# Patient Record
Sex: Female | Born: 1975 | Race: White | Hispanic: No | Marital: Married | Smoking: Never smoker
Health system: Southern US, Community
[De-identification: ages and names within clinical notes are randomized; demographics above are authoritative.]

## PROBLEM LIST (undated history)

## (undated) DIAGNOSIS — M19011 Primary osteoarthritis, right shoulder: Secondary | ICD-10-CM

## (undated) DIAGNOSIS — R112 Nausea with vomiting, unspecified: Secondary | ICD-10-CM

## (undated) DIAGNOSIS — Z9889 Other specified postprocedural states: Secondary | ICD-10-CM

## (undated) DIAGNOSIS — K900 Celiac disease: Secondary | ICD-10-CM

## (undated) DIAGNOSIS — G43909 Migraine, unspecified, not intractable, without status migrainosus: Secondary | ICD-10-CM

## (undated) DIAGNOSIS — M7550 Bursitis of unspecified shoulder: Secondary | ICD-10-CM

## (undated) DIAGNOSIS — L237 Allergic contact dermatitis due to plants, except food: Secondary | ICD-10-CM

## (undated) DIAGNOSIS — M75100 Unspecified rotator cuff tear or rupture of unspecified shoulder, not specified as traumatic: Secondary | ICD-10-CM

## (undated) DIAGNOSIS — N83209 Unspecified ovarian cyst, unspecified side: Secondary | ICD-10-CM

## (undated) HISTORY — PX: TONSILLECTOMY: SUR1361

## (undated) HISTORY — PX: CARPAL TUNNEL RELEASE: SHX101

## (undated) HISTORY — PX: CERVICAL LAMINECTOMY: SHX94

## (undated) HISTORY — PX: BACK SURGERY: SHX140

## (undated) HISTORY — PX: CHOLECYSTECTOMY: SHX55

---

## 1998-04-01 ENCOUNTER — Inpatient Hospital Stay: Admission: AD | Admit: 1998-04-01 | Discharge: 1998-04-01 | Payer: Self-pay | Admitting: Obstetrics and Gynecology

## 1998-04-02 ENCOUNTER — Ambulatory Visit (HOSPITAL_COMMUNITY): Admission: RE | Admit: 1998-04-02 | Discharge: 1998-04-02 | Payer: Self-pay | Admitting: Obstetrics and Gynecology

## 1998-05-14 ENCOUNTER — Ambulatory Visit: Admission: RE | Admit: 1998-05-14 | Discharge: 1998-05-14 | Payer: Self-pay | Admitting: Neurology

## 1998-06-10 ENCOUNTER — Inpatient Hospital Stay (HOSPITAL_COMMUNITY): Admission: AD | Admit: 1998-06-10 | Discharge: 1998-06-13 | Payer: Self-pay | Admitting: Obstetrics and Gynecology

## 1998-06-20 ENCOUNTER — Inpatient Hospital Stay (HOSPITAL_COMMUNITY): Admission: AD | Admit: 1998-06-20 | Discharge: 1998-06-20 | Payer: Self-pay | Admitting: Obstetrics and Gynecology

## 1998-06-24 ENCOUNTER — Observation Stay (HOSPITAL_COMMUNITY): Admission: RE | Admit: 1998-06-24 | Discharge: 1998-06-25 | Payer: Self-pay | Admitting: General Surgery

## 2001-10-22 ENCOUNTER — Other Ambulatory Visit: Admission: RE | Admit: 2001-10-22 | Discharge: 2001-10-22 | Payer: Self-pay | Admitting: Obstetrics and Gynecology

## 2002-05-07 ENCOUNTER — Inpatient Hospital Stay (HOSPITAL_COMMUNITY): Admission: AD | Admit: 2002-05-07 | Discharge: 2002-05-09 | Payer: Self-pay | Admitting: Obstetrics and Gynecology

## 2002-06-10 ENCOUNTER — Other Ambulatory Visit: Admission: RE | Admit: 2002-06-10 | Discharge: 2002-06-10 | Payer: Self-pay | Admitting: Obstetrics and Gynecology

## 2003-09-16 ENCOUNTER — Other Ambulatory Visit: Admission: RE | Admit: 2003-09-16 | Discharge: 2003-09-16 | Payer: Self-pay | Admitting: Obstetrics and Gynecology

## 2004-09-27 ENCOUNTER — Other Ambulatory Visit: Admission: RE | Admit: 2004-09-27 | Discharge: 2004-09-27 | Payer: Self-pay | Admitting: Obstetrics and Gynecology

## 2006-12-22 ENCOUNTER — Encounter (INDEPENDENT_AMBULATORY_CARE_PROVIDER_SITE_OTHER): Payer: Self-pay | Admitting: Obstetrics and Gynecology

## 2006-12-22 ENCOUNTER — Ambulatory Visit (HOSPITAL_COMMUNITY): Admission: RE | Admit: 2006-12-22 | Discharge: 2006-12-22 | Payer: Self-pay | Admitting: Obstetrics and Gynecology

## 2006-12-22 HISTORY — PX: HYSTEROSCOPY W/D&C: SHX1775

## 2006-12-22 HISTORY — PX: ENDOMETRIAL ABLATION W/ NOVASURE: SUR434

## 2009-04-16 ENCOUNTER — Ambulatory Visit: Payer: Self-pay | Admitting: Genetic Counselor

## 2010-04-23 ENCOUNTER — Emergency Department (HOSPITAL_BASED_OUTPATIENT_CLINIC_OR_DEPARTMENT_OTHER)
Admission: EM | Admit: 2010-04-23 | Discharge: 2010-04-23 | Payer: Self-pay | Source: Home / Self Care | Admitting: Emergency Medicine

## 2010-04-24 ENCOUNTER — Encounter: Payer: Self-pay | Admitting: Obstetrics and Gynecology

## 2010-04-26 LAB — CBC
HCT: 38.7 % (ref 36.0–46.0)
MCH: 28.2 pg (ref 26.0–34.0)
MCHC: 34.4 g/dL (ref 30.0–36.0)
RDW: 13.6 % (ref 11.5–15.5)

## 2010-04-26 LAB — BASIC METABOLIC PANEL
Calcium: 9 mg/dL (ref 8.4–10.5)
Creatinine, Ser: 0.9 mg/dL (ref 0.4–1.2)
GFR calc Af Amer: >60 mL/min (ref >60)
GFR calc non Af Amer: >60 mL/min (ref >60)
Glucose, Bld: 99 mg/dL (ref 70–99)
Sodium: 141 mEq/L (ref 135–145)

## 2010-04-26 LAB — URINALYSIS, ROUTINE W REFLEX MICROSCOPIC
Ketones, ur: NEGATIVE mg/dL
Nitrite: NEGATIVE
Protein, ur: NEGATIVE mg/dL

## 2010-04-26 LAB — DIFFERENTIAL
Basophils Absolute: 0 10*3/uL (ref 0.0–0.1)
Basophils Relative: 0 % (ref 0–1)
Eosinophils Relative: 1 % (ref 0–5)
Monocytes Absolute: 0.7 10*3/uL (ref 0.1–1.0)

## 2010-04-28 LAB — CBC
HCT: 40.8 % (ref 36.0–46.0)
MCHC: 33.8 g/dL (ref 30.0–36.0)
MCV: 84.3 fL (ref 78.0–100.0)
Platelets: 212 10*3/uL (ref 150–400)
RDW: 13.3 % (ref 11.5–15.5)
WBC: 6.8 10*3/uL (ref 4.0–10.5)

## 2010-04-30 ENCOUNTER — Ambulatory Visit (HOSPITAL_COMMUNITY)
Admission: RE | Admit: 2010-04-30 | Discharge: 2010-04-30 | Payer: Self-pay | Source: Home / Self Care | Attending: Obstetrics and Gynecology | Admitting: Obstetrics and Gynecology

## 2010-04-30 HISTORY — PX: ENDOMETRIAL ABLATION: SHX621

## 2010-05-10 NOTE — Op Note (Signed)
Sheri Evans, Sheri Evans            ACCOUNT NO.:  [id number]  MEDICAL RECORD NO.:  [id number]          PATIENT TYPE:  AMB  LOCATION:  [place]                           FACILITY:  [facility]  PHYSICIAN:  Daleen Bo. Gaetano Net, M.D. DATE OF BIRTH:  01-20-1976  DATE OF PROCEDURE:  04/30/2010 DATE OF DISCHARGE:  04/23/2010                              OPERATIVE REPORT   PREOPERATIVE DIAGNOSIS:  Pelvic pain.  POSTOPERATIVE DIAGNOSIS:  Endometriosis.  PROCEDURE:  Open laparoscopy with ablation of endometriosis.  SURGEON:  Daleen Bo. Gaetano Net, MD  ANESTHESIA:  General with endotracheal intubation.  ESTIMATED BLOOD LOSS:  Minimal.  INDICATIONS AND CONSENT:  The patient is a 35 year old married white female G3, P3, husband status post vasectomy and the patient status post endometrial ablation with increasingly frequent and severe primary left and some right lower quadrant pain.  Pain is especially worse before her menses.  Laparoscopy has been discussed preoperatively.  Potential risks and complications have been discussed preoperatively including but not limited to infection, organ damage, bleeding requiring transfusion of blood products with HIV and hepatitis acquisition, DVT, PE, pneumonia, recurrent pain, and pain with intercourse.  All questions have been answered and consent was signed on the chart.  FINDINGS:  Upper abdomen was grossly normal.  Appendix was normal. Uterus was about 8 weeks in size, somewhat boggy but smooth in contour. Anterior and posterior cul-de-sacs and pelvic sidewalls are normal. Right ovary and both fallopian tubes are normal.  Left ovary has a small light brown implant consistent with endometriosis and several less than 1-cm follicles that are smooth and translucent.  DESCRIPTION OF PROCEDURE:  The patient was taken to the operating room where she was identified, placed in dorsal supine position, and general anesthesia was induced via endotracheal intubation.  She was  placed in dorsal lithotomy position.  Time-out was undertaken.  She was prepped with Hibiclens abdominally and vaginally.  Bladder straight catheterized.  Hulka tenaculum was placed in the uterus as a manipulator and she was draped in a sterile fashion.  The infraumbilical and suprapubic areas were injected in the midline with 0.5% plain Marcaine. An infraumbilical incision was made.  Dissection was carried out to the level of the fascia.  This was grasped and elevated and taken down in the midline with Mayo scissors.  The peritoneum was bluntly perforated with finger.  Anchoring sutures of 0 Vicryl were placed at the 3 and 9 o'clock position under good visualization.  A disposable open laparoscopic trocar sleeve was placed.  The balloon was inflated.  The sleeve was anchored in place with Vicryl sutures.  Pneumoperitoneum was induced and the operative laparoscope was used.  A small suprapubic incision was made in the midline and a 5-mm Xcel bladeless disposable trocar sleeve was placed under direct visualization without difficulty. Thorough inspection reveals the above findings.  The small implant on the left ovary was ablated thoroughly with bipolar cautery.  Interceed was back loaded through the laparoscope, placed around the ovary. Approximately 25 mL of the remaining 0.5% plain Marcaine was instilled into peritoneal cavity.  Suprapubic trocar sleeve was removed and the umbilical trocar sleeve was removed.  The 0 Vicryl sutures were used to close the fascia under good visualization.  Inspection reveals good closure.  The subcutaneous tissue was closed with 0 Vicryl and the skin was closed with interrupted 3-0 Vicryl suture which was also used on the suprapubic incision.  Dermabond was applied.  Hulka tenaculum was removed and no bleeding was noted.  All counts were correct.  The patient was awakened, taken to the recovery room in stable condition.     Daleen Bo Gaetano Net,  M.D.     JET/MEDQ  D:  04/30/2010  T:  04/30/2010  Job:  [id number]  Electronically Signed by Everlene Farrier M.D. on 05/10/2010 09:02:11 AM

## 2010-08-17 NOTE — Op Note (Signed)
Sheri Evans, Sheri Evans            ACCOUNT NO.:  [id number]   MEDICAL RECORD NO.:  [id number]          PATIENT TYPE:  AMB   LOCATION:  [place]                           FACILITY:  [facility]   PHYSICIAN:  Daleen Bo. Gaetano Net, M.D. DATE OF BIRTH:  09/14/1975   DATE OF PROCEDURE:  12/22/2006  DATE OF DISCHARGE:                               OPERATIVE REPORT   PREOPERATIVE DIAGNOSIS:  Menorrhagia.   POSTOPERATIVE DIAGNOSIS:  Menorrhagia.   PROCEDURE:  Novasure endometrial ablation, hysteroscopy with dilatation  and curettage.  1% Xylocaine paracervical block.   SURGEON:  Everlene Farrier, MD.   ANESTHESIA:  General.   SPECIMENS:  Endometrial curettings to pathology.   ESTIMATED BLOOD LOSS:  Minimal.   I&O'S OF DISTENDING MEDIA:  30 mL deficit.   INDICATIONS AND CONSENT:  This patient is a 35 year old, married, white  female, G3, P3, husband status post vasectomy with heavy bleeding.  Details are dictated in the history and physical.  Hysteroscopy D&C,  Novasure endometrial ablation is discussed preoperatively. The potential  risks and complications have been discussed preoperatively including but  not limited to infection, uterine perforation, organ damage, bleeding  requiring transfusion of blood products with possible HIV and hepatitis  acquisition, DVT, PE, pneumonia, recurrent heavy bleeding.  All  questions have been answered and consent is signed on the chart.   FINDINGS:  Endometrial cavities without abnormal structure.   DESCRIPTION OF PROCEDURE:  The patient is taken to the operating room  where she is identified, placed in dorsal supine position and general  anesthesia is induced.  She is then placed in the dorsal lithotomy  position where she is prepped, bladder straight catheterized and she is  draped in a sterile fashion. A bivalve speculum is placed in the vagina.  The anterior cervical lip is injected with 1% Xylocaine and grasped with  a single-tooth tenaculum.  Paracervical  block was placed in the 2, 4, 5,  7, 8 and 10 o'clock positions with approximately 20 mL of plain 1%  Xylocaine.  The cervical length and uterine length were then taken.  The  cervix was gently and progressively dilated to a 29 dilator.  The  diagnostic hysteroscope is placed into the cervical canal and advanced  under direct visualization using distending media.  The above findings  are noted.  The hysteroscope is withdrawn and sharp curettage is carried  out.  The Novasure endometrial device is then placed.  However, careful  inspection reveals that after a warning light is noted on the machine  that the array is not deployed, the device is then removed.  Inspection  after the device has been removed reveals that although the handle is  closed the array does not indeed open even though the reference gauge  displays a cavity width suggesting that it had deployed.  This device  was never used for ablation. It  was then replaced with a new device.  The cavity test is passed on the  first attempt.  Ablation is carried out.  The device is removed and seen  to be intact.  Inspection with the hysteroscope again reveals  no  evidence of perforation.  All instruments are removed. The patient is  awakened, taken to recovery room in stable condition.      Daleen Bo Gaetano Net, M.D.  Electronically Signed     JET/MEDQ  D:  12/22/2006  T:  12/23/2006  Job:  [id number]

## 2010-08-17 NOTE — H&P (Signed)
Sheri Evans, Sheri Evans            ACCOUNT NO.:  [id number]   MEDICAL RECORD NO.:  [id number]          PATIENT TYPE:  AMB   LOCATION:                                FACILITY:  [facility]   PHYSICIAN:  Daleen Bo. Gaetano Net, M.D. DATE OF BIRTH:  Aug 24, 1975   DATE OF ADMISSION:  12/22/2006  DATE OF DISCHARGE:                              HISTORY & PHYSICAL   CHIEF COMPLAINT:  Heavy menses.   HISTORY OF PRESENTING ILLNESS:  This patient is a 35 year old married  white female G3, P3. Husband is status post vasectomy who has extremely  heavy bleeding.  She will change 6 pads in 2 hours.  Attempts at  controlling this have been unsuccessful.  She is being admitted for  hysteroscopy, D & C and NovaSure  endometrial ablation.  Potential risks  and complications have been discussed preoperatively.   PAST MEDICAL HISTORY:  Negative.   PAST SURGICAL HISTORY:  Laparoscopy with ablation of endometriosis in  1998.  Tonsillectomy.   OBSTETRICAL HISTORY:  Cesarean section x3.   FAMILY HISTORY:  Heart disease maternal grandmother.  Varicose veins in  mother.  Diabetes, maternal grandfather.   MEDICATIONS:  None.   ALLERGIES:  PENICILLIN, leading to swelling.   SOCIAL HISTORY:  Denies tobacco, alcohol, or drug abuse.   REVIEW OF SYSTEMS:  NEUROLOGICAL:  Denies headache.  CARDIAC:  No chest  pain.  PULMONARY:  Denies shortness of breath. GI: Denies recent changes  in bowel habits.   PHYSICAL EXAMINATION:  VITAL SIGNS:  Height 5 feet 9 inches. Weight 283  pounds.  Blood pressure 122/84.  HEENT: Without thyromegaly.  LUNGS: Clear to auscultation.  HEART: Regular rate and rhythm.  BACK:  Without CVA tenderness .  BREASTS:  Without masses, tracks, or discharge.  ABDOMEN:  Soft and nontender without masses.  PELVIC:  Both vagina and cervix without  lesions.  Uterus is 8 weeks in  size.  Vulva nontender.  Adnexa non-tender without masses.  EXTREMITIES: Grossly within normal limits.  NEUROLOGICAL:  Grossly within normal limits.   Ultrasound in the office on 11/07/2006 reveals a uterus measuring 10.1 x  7.7 x 5.3 cm. There is an 11 and 7 mm intramural fibroids noted. The  ovaries appear normal.   ASSESSMENT:  Menorrhagia.   PLAN:  Hysteroscopy D&C, and NovaSure endometrial ablation.      Daleen Bo Gaetano Net, M.D.  Electronically Signed     JET/MEDQ  D:  12/13/2006  T:  12/13/2006  Job:  [id number]

## 2010-08-20 NOTE — Op Note (Signed)
Sheri Evans, Sheri Evans                      ACCOUNT NO.:  [id number]   MEDICAL RECORD NO.:  [id number]                   PATIENT TYPE:  INP   LOCATION:  NA                                   FACILITY:  [facility]   PHYSICIAN:  Daleen Bo. Lyn Hollingshead, M.D.           DATE OF BIRTH:  [birth year]   DATE OF PROCEDURE:  05/07/2002  DATE OF DISCHARGE:                                 OPERATIVE REPORT   PREOPERATIVE DIAGNOSES:  1. Intrauterine pregnancy at term.  2. Previous cesarean section, desires repeat.   POSTOPERATIVE DIAGNOSES:  1. Intrauterine pregnancy at term.  2. Previous cesarean section, desires repeat.   PROCEDURE:  Low transverse cesarean section.   SURGEON:  Daleen Bo. Gaetano Net, M.D.   ASSISTANT:  Ralene Bathe. Matthew Saras, M.D.   ANESTHESIA:  Epidural, Juliann Pulse, M.D.   ESTIMATED BLOOD LOSS:  800 mL.   FINDINGS:  Viable female infant.  Apgars of 9 and 9 at one and five minutes,  respectively.  Birth weight 8 pounds 5 ounces.  Arterial cord pH 7.32.   INDICATIONS AND CONSENT:  This patient is a 35 year old married white female  with two previous cesarean sections who desires repeat.  Details are  discussed in the history and physical.  Cesarean section and its possible  risks and complications have been discussed with the patient preoperatively  including, but not limited to, infection, bowel, bladder, ureteral damage,  bleeding requiring transfusion of blood products with possible transfusion  reaction, HIV and hepatitis acquisition, DVT, PE, and pneumonia.  All  questions are answered and consent is signed on the chart.   PROCEDURE:  The patient is taken to the operating room where an epidural  anesthetic is placed.  She is placed in the dorsal supine position with a 15  degree left lateral wedge.  She is prepped.  Foley catheter is placed in the  bladder as a drain and she is draped in a sterile fashion.  After testing  for adequate epidural anesthesia, skin is entered  through the Pfannenstiel  incision and dissection is carried out in layers to the peritoneum.  Peritoneum is entered sharply and extended superiorly and inferiorly.  Vesicouterine peritoneum is taken down cephalolaterally and the bladder flap  is developed.  There is a 2 cm window in the uterine scar in the midline.  This is extended cephalolaterally with the fingers.  Artificial rupture of  membranes is carried out for clear fluid.  The vertex is delivered and oro  and nasopharynx are suctioned.  Remainder of the infant is delivered.  Good  cry and tone is noted.  Cord is clamped and cut and the infant is handed to  the waiting pediatrics team.  Placenta is manually delivered.  The uterus  has a heart shaped contour.  The uterine cavity is clean.  Uterus is closed  in a running locking fashion with 0 Monocryl suture.  Good hemostasis is  noted.  Tubes and ovaries are normal bilaterally.  Anterior peritoneum  is closed in a running fashion with 0 Monocryl which is also used to  reapproximate the pyramidalis muscle in the midline.  The anterior rectus  fascia is closed in a running fashion with 0 PDS suture and the skin is  closed with clips.  All counts are correct.  The patient is taken to  recovery room in stable condition.                                               Daleen Bo Lyn Hollingshead, M.D.    JET/MEDQ  D:  05/07/2002  T:  05/07/2002  Job:  [id number]

## 2010-08-20 NOTE — Discharge Summary (Signed)
Sheri Evans, Sheri Evans                      ACCOUNT NO.:  [id number]   MEDICAL RECORD NO.:  [id number]                   PATIENT TYPE:  INP   LOCATION:  9142                                 FACILITY:  [facility]   PHYSICIAN:  Maisie Fus  M.D.               DATE OF BIRTH:  10/07/1975   DATE OF ADMISSION:  05/07/2002  DATE OF DISCHARGE:  05/09/2002                                 DISCHARGE SUMMARY   ADMISSION DIAGNOSES:  1. Intrauterine pregnancy at term.  2. Previous Cesarean section, desires repeat.   DISCHARGE DIAGNOSES:  1. Status post low transverse Cesarean section.  2. Viable female infant.   PROCEDURE:  Repeat low transverse Cesarean section.   REASON FOR ADMISSION:  Please see dictated history and physical.   HOSPITAL COURSE:  The patient was a 35 year old married female, Gravida III,  Para II, that was admitted to [facility] for scheduled  Cesarean section delivery. The patient had had two previous Cesarean  sections and desired repeat. On the morning of admission, the patient was  prepped accordingly and taken to the OR where epidural was placed without  difficulty. A low transverse incision was made with the delivery of a viable  female infant weighing 8 pounds and 5 ounces with Apgar's of 9 at one minute  and 9 at five minutes. Arterial cord pH was 7.32. The patient tolerated the  procedure well and was taken to the recovery room  in stable condition. On  postoperative day one the patient did return to bowel function. Abdomen was  soft. Fundus was firm and nontender. Abdominal dressing was noted to have a  small amount of drainage noted on bandage. Labs revealed hemoglobin of 9.9,  platelet count 150,000 with WBC count of 10.3. IV was discontinued and  regular diet was ordered. On postoperative day two, vital signs were stable.  Fundus was firm and nontender. Abdomen was soft. Incision was noted to be  clean, dry and intact. The patient desired  discharge. Teaching was reviewed  with the patient and she was discharged to home.   CONDITION ON DISCHARGE:  Good.   DIET:  Regular as tolerated.   ACTIVITY:  No heavy lifting, no driving for two weeks and no vaginal entry.   FOLLOW UP:  The patient is to follow-up in the office in 2-3 days for staple  removal. Otherwise, she is to return to the office in one week for incision  check. She is to call for temperature greater than 100, persistent nausea  and vomiting, heavy vaginal bleeding, and/or redness or drainage to the  incisional site.   DISCHARGE MEDICATIONS:  1. Demerol 50 mg #30, one by mouth every four to six hours as needed.  2.     Motrin 600 mg #30, one by mouth every six hours as needed.  3. Prenatal vitamins one by mouth daily.  4. Colace one by mouth  daily as needed.     Juanda Chance, N.P.                        Maisie Fus  M.D.    CC/MEDQ  D:  06/04/2002  T:  06/04/2002  Job:  [id number]   cc:   Ralene Bathe. Matthew Saras, M.D.  [address], [place]  [place] [address]  Fax: [phone]

## 2010-08-20 NOTE — H&P (Signed)
   Sheri Evans, Sheri Evans                      ACCOUNT NO.:  [id number]   MEDICAL RECORD NO.:  [id number]                   PATIENT TYPE:  INP   LOCATION:  NA                                   FACILITY:  [facility]   PHYSICIAN:  Daleen Bo. Lyn Hollingshead, M.D.           DATE OF BIRTH:  [birth year]   DATE OF ADMISSION:  05/07/2002  DATE OF DISCHARGE:                                HISTORY & PHYSICAL   CHIEF COMPLAINT:  Repeat cesarean section.   HISTORY OF PRESENT ILLNESS:  The patient is a 35 year old married female,  G3, P2, with an EDC of May 07, 2002, established by six-week ultrasound,  who has had two previous cesarean sections and is being admitted for a  repeat cesarean section.  Prenatal care has been complicated by obesity.  Glucola was normal at 28 weeks.  She had a complaint of mild shortness of  breath and spots before her eyes in the mornings when she awakens on May 01, 2002.  At that time, blood pressure was 126/58 with trace protein in the  urine.  All questions have been answered, and she is being admitted for a  repeat cesarean section.   PAST OBSTETRIC HISTORY:  See [place] form.   PAST SURGICAL HISTORY:  See [place] form.   PAST MEDICAL HISTORY:  See [place] form.   FAMILY HISTORY:  See [place] form.   MEDICATIONS:  Prenatal vitamins.   ALLERGIES:  Penicillin.   LABORATORY DATA:  Group B strep culture is negative on April 09, 2002.  Blood type O positive.  RH antibody screen negative.  RPR nonreactive.  Rubella immune.  Hepatitis B surface antigen negative.  Gonorrhea and  Chlamydia negative.  PTT negative.   ASSESSMENT:  1. Intrauterine pregnancy at 40 and 0/7 weeks.  2. History of two previous cesarean sections.   PLAN:  Repeat cesarean section.                                               Daleen Bo Lyn Hollingshead, M.D.    JET/MEDQ  D:  05/06/2002  T:  05/06/2002  Job:  [id number]

## 2011-01-13 LAB — CBC
MCHC: 33.8
MCV: 81.3
Platelets: 278
RDW: 14.8 — ABNORMAL HIGH

## 2011-01-13 LAB — BASIC METABOLIC PANEL
BUN: 10
CO2: 28
Calcium: 9.3
Chloride: 102
Creatinine, Ser: 0.92
GFR calc Af Amer: >60

## 2011-04-05 HISTORY — PX: ABDOMINAL HYSTERECTOMY: SHX81

## 2011-05-12 ENCOUNTER — Encounter (HOSPITAL_BASED_OUTPATIENT_CLINIC_OR_DEPARTMENT_OTHER): Payer: Self-pay | Admitting: *Deleted

## 2011-05-12 ENCOUNTER — Emergency Department (HOSPITAL_BASED_OUTPATIENT_CLINIC_OR_DEPARTMENT_OTHER)
Admission: EM | Admit: 2011-05-12 | Discharge: 2011-05-12 | Disposition: A | Discharge status: Home / Self Care | Payer: BC Managed Care – PPO | Attending: Emergency Medicine | Admitting: Emergency Medicine

## 2011-05-12 ENCOUNTER — Emergency Department (INDEPENDENT_AMBULATORY_CARE_PROVIDER_SITE_OTHER): Payer: BC Managed Care – PPO

## 2011-05-12 DIAGNOSIS — R109 Unspecified abdominal pain: Secondary | ICD-10-CM

## 2011-05-12 DIAGNOSIS — N809 Endometriosis, unspecified: Secondary | ICD-10-CM

## 2011-05-12 DIAGNOSIS — N949 Unspecified condition associated with female genital organs and menstrual cycle: Secondary | ICD-10-CM

## 2011-05-12 DIAGNOSIS — R110 Nausea: Secondary | ICD-10-CM | POA: Insufficient documentation

## 2011-05-12 DIAGNOSIS — N9489 Other specified conditions associated with female genital organs and menstrual cycle: Secondary | ICD-10-CM | POA: Insufficient documentation

## 2011-05-12 LAB — COMPREHENSIVE METABOLIC PANEL
AST: 21 U/L (ref 0–37)
Albumin: 4.1 g/dL (ref 3.5–5.2)
Alkaline Phosphatase: 64 U/L (ref 39–117)
BUN: 9 mg/dL (ref 6–23)
Creatinine, Ser: 0.8 mg/dL (ref 0.50–1.10)
Potassium: 4.3 mEq/L (ref 3.5–5.1)
Total Protein: 7.6 g/dL (ref 6.0–8.3)

## 2011-05-12 LAB — URINALYSIS, ROUTINE W REFLEX MICROSCOPIC
Ketones, ur: NEGATIVE mg/dL
Leukocytes, UA: NEGATIVE
Nitrite: NEGATIVE
Protein, ur: NEGATIVE mg/dL
Urobilinogen, UA: 0.2 mg/dL (ref 0.0–1.0)

## 2011-05-12 LAB — DIFFERENTIAL
Basophils Absolute: 0 10*3/uL (ref 0.0–0.1)
Basophils Relative: 0 % (ref 0–1)
Eosinophils Absolute: 0 10*3/uL (ref 0.0–0.7)
Monocytes Relative: 7 % (ref 3–12)
Neutrophils Relative %: 68 % (ref 43–77)

## 2011-05-12 LAB — CBC
Hemoglobin: 14.6 g/dL (ref 12.0–15.0)
MCH: 29.2 pg (ref 26.0–34.0)
MCHC: 34.8 g/dL (ref 30.0–36.0)
Platelets: 243 10*3/uL (ref 150–400)
RDW: 12.9 % (ref 11.5–15.5)

## 2011-05-12 LAB — PREGNANCY, URINE: Preg Test, Ur: NEGATIVE

## 2011-05-12 LAB — LIPASE, BLOOD: Lipase: 27 U/L (ref 11–59)

## 2011-05-12 LAB — WET PREP, GENITAL
Trich, Wet Prep: NONE SEEN
Yeast Wet Prep HPF POC: NONE SEEN

## 2011-05-12 MED ORDER — OXYCODONE-ACETAMINOPHEN 5-325 MG PO TABS: 2.0000 | ORAL_TABLET | ORAL | Status: AC | PRN

## 2011-05-12 MED ORDER — IOHEXOL 300 MG/ML  SOLN
20.0000 mL | INTRAMUSCULAR | Status: DC | PRN
  Administered 2011-05-12: 20 mL via ORAL

## 2011-05-12 MED ORDER — IOHEXOL 300 MG/ML  SOLN
100.0000 mL | Freq: Once | INTRAMUSCULAR | Status: AC | PRN
  Administered 2011-05-12: 100 mL via INTRAVENOUS

## 2011-05-12 MED ORDER — ONDANSETRON HCL 4 MG/2ML IJ SOLN
4.0000 mg | Freq: Once | INTRAMUSCULAR | Status: AC
  Administered 2011-05-12: 4 mg via INTRAVENOUS
  Filled 2011-05-12: qty 2

## 2011-05-12 MED ORDER — MORPHINE SULFATE 4 MG/ML IJ SOLN
4.0000 mg | Freq: Once | INTRAMUSCULAR | Status: AC
  Administered 2011-05-12: 4 mg via INTRAVENOUS
  Filled 2011-05-12: qty 1

## 2011-05-12 NOTE — ED Notes (Signed)
Sunday achy felt like had flu with off and on abdominal cramping today became more severe and localized at 415 pm no nausea or vomiting

## 2011-05-12 NOTE — ED Provider Notes (Signed)
History     CSN: [id number]  Arrival date & time 05/12/11  1633   First MD Initiated Contact with Patient 05/12/11 1652      Chief Complaint  Patient presents with  . Abdominal Pain    (Consider location/radiation/quality/duration/timing/severity/associated sxs/prior treatment) HPI Comments: Pt states that she has a history of endometriosis and this feels different:pt states that she has had lower abdominal pain the last 4 days but today the pain got worse and localized to the left side:pt denies history of similar symptoms  Patient is a 36 y.o. female presenting with abdominal pain. The history is provided by the patient.  Abdominal Pain The primary symptoms of the illness include abdominal pain and nausea. The primary symptoms of the illness do not include fever, vomiting, diarrhea, dysuria, vaginal discharge or vaginal bleeding. The current episode started 3 to 5 hours ago. The onset of the illness was gradual. The problem has been rapidly worsening.  The patient states that she believes she is currently not pregnant. The patient has not had a change in bowel habit. Symptoms associated with the illness do not include urgency, frequency or back pain.    History reviewed. No pertinent past medical history.  History reviewed. No pertinent past surgical history.  History reviewed. No pertinent family history.  History  Substance Use Topics  . Smoking status: Never Smoker   . Smokeless tobacco: Not on file  . Alcohol Use: Yes     occ    OB History    Grav Para Term Preterm Abortions TAB SAB Ect Mult Living                  Review of Systems  Constitutional: Negative for fever.  Gastrointestinal: Positive for nausea and abdominal pain. Negative for vomiting and diarrhea.  Genitourinary: Negative for dysuria, urgency, frequency, vaginal bleeding and vaginal discharge.  Musculoskeletal: Negative for back pain.  All other systems reviewed and are negative.    Allergies    Penicillins  Home Medications   Current Outpatient Rx  Name Route Sig Dispense Refill  . ACETAMINOPHEN 500 MG PO TABS Oral Take 1,000 mg by mouth every 6 (six) hours as needed. Patient used this medication today for pain,    . MULTIVITAMIN PO Oral Take 1 tablet by mouth daily.      BP 162/73  Pulse 92  Resp 22  SpO2 100%  LMP 05/01/2011  Physical Exam  Nursing note and vitals reviewed. Constitutional: She is oriented to person, place, and time. She appears well-developed and well-nourished.  HENT:  Head: Normocephalic and atraumatic.  Eyes: Conjunctivae and EOM are normal.  Neck: Neck supple.  Cardiovascular: Normal rate and regular rhythm.   Pulmonary/Chest: Effort normal and breath sounds normal.  Abdominal: Soft. Bowel sounds are normal. There is tenderness in the left lower quadrant.  Genitourinary: Cervix exhibits no motion tenderness. Left adnexum displays tenderness. No vaginal discharge found.  Musculoskeletal: Normal range of motion.  Neurological: She is alert and oriented to person, place, and time.  Skin: Skin is warm and dry.  Psychiatric: She has a normal mood and affect.    ED Course  Procedures (including critical care time)  Labs Reviewed  COMPREHENSIVE METABOLIC PANEL - Abnormal; Notable for the following:    Glucose, Bld 107 (*)    All other components within normal limits  WET PREP, GENITAL - Abnormal; Notable for the following:    Clue Cells Wet Prep HPF POC FEW (*)  WBC, Wet Prep HPF POC FEW (*)    All other components within normal limits  URINALYSIS, ROUTINE W REFLEX MICROSCOPIC  PREGNANCY, URINE  CBC  DIFFERENTIAL  LIPASE, BLOOD  GC/CHLAMYDIA PROBE AMP, GENITAL   Ct Abdomen Pelvis W Contrast  05/12/2011  *RADIOLOGY REPORT*  Clinical Data: 36 year old female with lower abdominal pain greater on the left.  Prior cholecystectomy.  Endometriosis.  CT ABDOMEN AND PELVIS WITH CONTRAST  Technique:  Multidetector CT imaging of the abdomen and  pelvis was performed following the standard protocol during bolus administration of intravenous contrast.  Contrast: 140m OMNIPAQUE IOHEXOL 300 MG/ML IV SOLN, 268mOMNIPAQUE IOHEXOL 300 MG/ML IV SOLN  Comparison: 04/23/2010.  Findings: Clear lung bases.  Degenerative changes in the spine. No acute osseous abnormality identified.  No pelvic free fluid.  Indistinct appearance of the lower uterine segment and cervix, but no mass is evident.  Mild asymmetric enlargement of the left adnexa is similar to the previous exam and appears to be related to multiple small cysts.  Multiple phleboliths.  Unremarkable bladder.  Negative distal colon.  Negative more proximal colon.  Normal appendix.  No dilated small bowel.  Oral contrast has not yet reached the distal small bowel. Negative stomach and duodenum.  Gallbladder is surgically absent.  Mild decreased density in the liver compatible with a degree of fatty infiltration is stable. Negative spleen, pancreas, adrenal glands, kidneys, portal venous system, and major arterial structures.  No abdominal free fluid. No lymphadenopathy.  IMPRESSION: No inflammatory findings in the abdomen or pelvis. Multiple small left adnexal cysts, likely physiologic.  Original Report Authenticated By: H.Randall AnM.D.     1. Adnexal cyst       MDM  Nothing acute noted on exam:pt is okay to follow up with gyn       VrGlendell DockerNP 05/12/11 [phone]

## 2011-05-14 LAB — GC/CHLAMYDIA PROBE AMP, GENITAL: GC Probe Amp, Genital: NEGATIVE

## 2011-05-15 NOTE — ED Provider Notes (Signed)
Medical screening examination/treatment/procedure(s) were performed by non-physician practitioner and as supervising physician I was immediately available for consultation/collaboration.   Mervin Kung, MD 05/15/11 2005

## 2011-07-18 ENCOUNTER — Other Ambulatory Visit: Payer: Self-pay | Admitting: Obstetrics and Gynecology

## 2011-07-29 ENCOUNTER — Inpatient Hospital Stay (HOSPITAL_COMMUNITY)
Admission: AD | Admit: 2011-07-29 | Discharge: 2011-07-30 | Disposition: A | Discharge status: Home / Self Care | Payer: BC Managed Care – PPO | Source: Ambulatory Visit | Attending: Obstetrics and Gynecology | Admitting: Obstetrics and Gynecology

## 2011-07-29 ENCOUNTER — Encounter (HOSPITAL_COMMUNITY): Payer: Self-pay | Admitting: *Deleted

## 2011-07-29 DIAGNOSIS — Z09 Encounter for follow-up examination after completed treatment for conditions other than malignant neoplasm: Secondary | ICD-10-CM

## 2011-07-29 DIAGNOSIS — R1084 Generalized abdominal pain: Secondary | ICD-10-CM

## 2011-07-29 DIAGNOSIS — IMO0002 Reserved for concepts with insufficient information to code with codable children: Secondary | ICD-10-CM | POA: Insufficient documentation

## 2011-07-29 HISTORY — DX: Other specified postprocedural states: Z98.890

## 2011-07-29 HISTORY — DX: Nausea with vomiting, unspecified: R11.2

## 2011-07-29 HISTORY — DX: Unspecified ovarian cyst, unspecified side: N83.209

## 2011-07-29 LAB — CBC
HCT: 41 % (ref 36.0–46.0)
MCH: 28.5 pg (ref 26.0–34.0)
MCHC: 33.7 g/dL (ref 30.0–36.0)
MCV: 84.7 fL (ref 78.0–100.0)
RDW: 12.8 % (ref 11.5–15.5)

## 2011-07-29 LAB — URINALYSIS, ROUTINE W REFLEX MICROSCOPIC
Glucose, UA: NEGATIVE mg/dL
Ketones, ur: NEGATIVE mg/dL
Protein, ur: NEGATIVE mg/dL

## 2011-07-29 MED ORDER — OXYCODONE-ACETAMINOPHEN 5-325 MG PO TABS
2.0000 | ORAL_TABLET | Freq: Once | ORAL | Status: AC
  Administered 2011-07-29: 2 via ORAL
  Filled 2011-07-29: qty 2

## 2011-07-29 NOTE — Progress Notes (Signed)
Has sharp, buring pain lower abd constantly and cramping comes and goes. After cramping has vag. bleeding

## 2011-07-29 NOTE — MAU Provider Note (Signed)
Mike Berntsen [id number] y.A.[id number] @Unknown  by LMP Chief Complaint  Patient presents with  . Vaginal Bleeding       Abdominal pain    First Provider Initiated Contact with Patient 07/29/11 2203      SUBJECTIVE  HPI: S/P [facility] 07/18/11. Reports new onset of midline and left low abd pain and bright red vaginal bleeding like a period today. She denies fever, chills, dizziness, drainage from incision or incision pain. Her recovery has been unremarkable prior to today. No urinary complaints. Mild constipation, taking stool softener. Frequent soft BMs.  Past Medical History  Diagnosis Date  . PONV (postoperative nausea and vomiting)   . Ovarian cyst   . Endometriosis    Past Surgical History  Procedure Date  . Cesarean section   . Abdominal hysterectomy   . Tonsillectomy   . Laporoscopy   . Gallbladder surgery    History   Social History  . Marital Status: Married    Spouse Name: N/A    Number of Children: N/A  . Years of Education: N/A   Occupational History  . Not on file.   Social History Main Topics  . Smoking status: Never Smoker   . Smokeless tobacco: Not on file  . Alcohol Use: Yes     occ  . Drug Use: No  . Sexually Active: Yes    Birth Control/ Protection: Surgical   Other Topics Concern  . Not on file   Social History Narrative  . No narrative on file   No current facility-administered medications on file prior to encounter.   Current Outpatient Prescriptions on File Prior to Encounter  Medication Sig Dispense Refill  . acetaminophen (TYLENOL) 500 MG tablet Take 1,000 mg by mouth every 6 (six) hours as needed. Patient used this medication today for pain,      . Multiple Vitamins-Minerals (MULTIVITAMIN PO) Take 1 tablet by mouth daily.       Allergies  Allergen Reactions  . Penicillins Anaphylaxis    ROS: Pertinent items in HPI  OBJECTIVE Blood pressure 144/78, pulse 81, temperature 98.2 F (36.8 C), temperature source Oral, resp. rate 20, height 5'  10" (1.778 m), weight 130.296 kg (287 lb 4 oz).  GENERAL: Well-developed, well-nourished female in mild distress.  HEENT: Normocephalic, good dentition HEART: normal rate RESP: normal effort ABDOMEN: Soft, mild tenderness in RLQ. Incisions healing well. Scant dry drainage on incision at umbilicus.  EXTREMITIES: Nontender, no edema NEURO: Alert and oriented SPECULUM EXAM: Small amount of blood noted, cervix surgically absent. One suture visualized, tissue well approximated. Entire cuff not visualized, No active bleeding. No erythema or purulent drainage.  BIMANUAL: mild tenderness. Uterus surgically absent.   LAB RESULTS Recent Results (from the past 168 hour(s))  CBC   Collection Time   07/29/11 10:40 PM      Component Value Range   WBC 10.9 (*) 4.0 - 10.5 (K/uL)   RBC 4.84  3.87 - 5.11 (MIL/uL)   Hemoglobin 13.8  12.0 - 15.0 (g/dL)   HCT 41.0  36.0 - 46.0 (%)   MCV 84.7  78.0 - 100.0 (fL)   MCH 28.5  26.0 - 34.0 (pg)   MCHC 33.7  30.0 - 36.0 (g/dL)   RDW 12.8  11.5 - 15.5 (%)   Platelets 287  150 - 400 (K/uL)  URINALYSIS, ROUTINE W REFLEX MICROSCOPIC   Collection Time   07/29/11 10:50 PM      Component Value Range   Color, Urine YELLOW  YELLOW  APPearance CLEAR  CLEAR    Specific Gravity, Urine 1.025  1.005 - 1.030    pH 5.5  5.0 - 8.0    Glucose, UA NEGATIVE  NEGATIVE (mg/dL)   Hgb urine dipstick LARGE (*) NEGATIVE    Bilirubin Urine NEGATIVE  NEGATIVE    Ketones, ur NEGATIVE  NEGATIVE (mg/dL)   Protein, ur NEGATIVE  NEGATIVE (mg/dL)   Urobilinogen, UA 0.2  0.0 - 1.0 (mg/dL)   Nitrite NEGATIVE  NEGATIVE    Leukocytes, UA TRACE (*) NEGATIVE   URINE MICROSCOPIC-ADD ON   Collection Time   07/29/11 10:50 PM      Component Value Range   Squamous Epithelial / LPF FEW (*) RARE    WBC, UA 0-2  <3 (WBC/hpf)   RBC / HPF TOO NUMEROUS TO COUNT  <3 (RBC/hpf)   IMAGING NA  ED Course:   ASSESSMENT 1. Post-op bleeding    PLAN D/C home per consult w/Dr. Helane Rima F/U at  [facility] as scheduled for post-op visit Follow-up Information    Follow up with [place]. (As needed if symptoms worsen)    Contact information:   [place] [phone]        Medication List  As of 08/03/2011  3:07 PM   START taking these medications         ciprofloxacin 500 MG tablet   Commonly known as: CIPRO   Take 1 tablet (500 mg total) by mouth 2 (two) times daily.      * oxyCODONE-acetaminophen 10-325 MG per tablet   Commonly known as: PERCOCET   Take 1 tablet by mouth every 6 (six) hours as needed for pain.     * Notice: This list has 1 medication(s) that are the same as other medications prescribed for you. Read the directions carefully, and ask your doctor or other care provider to review them with you.       CONTINUE taking these medications         methocarbamol 500 MG tablet   Commonly known as: ROBAXIN      * oxyCODONE-acetaminophen 10-325 MG per tablet   Commonly known as: PERCOCET     * Notice: This list has 1 medication(s) that are the same as other medications prescribed for you. Read the directions carefully, and ask your doctor or other care provider to review them with you.        Where to get your medications    These are the prescriptions that you need to pick up.   You may get these medications from any pharmacy.         ciprofloxacin 500 MG tablet   oxyCODONE-acetaminophen 10-325 MG per tablet          Bleeding and infection precuations  Trenell Concannon 07/29/2011 10:32 PM

## 2011-07-29 NOTE — MAU Note (Signed)
Slight bruising mid abd and incisions clean and dry.

## 2011-07-29 NOTE — MAU Note (Signed)
Pt states, " I had a lap assisted hysterectomy on April 15th. I had been spotting all along, but at 6 pm, I had a big gush of blood, and it is like a period again. I started having pain at my belly button last night and it was there when I got up this morning and it has continued all day."

## 2011-07-30 MED ORDER — CIPROFLOXACIN HCL 500 MG PO TABS: 500.0000 mg | ORAL_TABLET | Freq: Two times a day (BID) | ORAL | Status: AC

## 2011-07-30 MED ORDER — OXYCODONE-ACETAMINOPHEN 10-325 MG PO TABS: 1.0000 | ORAL_TABLET | Freq: Four times a day (QID) | ORAL | Status: AC | PRN

## 2011-07-30 NOTE — Discharge Instructions (Signed)
Postsurgical Bleeding You have developed bleeding after surgery. A little bleeding following surgery may be normal. Sometimes a second surgery to stop the bleeding is needed.  SYMPTOMS  The problems of bleeding following surgery depend on the amount and location of the bleeding:  Sometimes there is a little bleeding from a wound following surgery. This is extremely common. It is not usually problem.   Some surgeries will always have a small amount of bleeding following the surgery. An example of this would be a D & C (dilatation and curettage). This is a procedure where the inside of the uterus is scraped out. Because the surface is raw inside the uterus after the procedure, there is almost always some bleeding or oozing.   Occasionally there may be a small vessel that either breaks loose from a suture (stitch) or a vessel that was not bleeding during the procedure because of spasm in the vessel. Then when the spasm goes away after surgery, bleeding begins.   Bleeding into your brain after brain surgery happens occasionally and is very dangerous.  DIAGNOSIS  Your caregiver will often know what is wrong by examining you.  TREATMENT   The treatment of bleeding problems following surgery will depend on the amount and the location.   Your caregiver will decide if it is safe to watch this. If the problem does not get better, some additional surgery may be needed.   Worrisome bleeding may require faster action and more surgery. It may be necessary to take you immediately back to surgery if there is a sudden loss of blood pressure following surgery. There may not be time to consult with family, or even the patient, if the problems are sudden and severe.   A blood transfusion may be needed.  HOME CARE INSTRUCTIONS If you have questions about your care, discuss them with your caregiver. Make sure all of your questions are answered. SEEK MEDICAL CARE IF: Bleeding is increased, or there is increased  pain, swelling, heat or redness in the wound or you develop an unexplained temperature. Document Released: 06/10/2004 Document Revised: 03/10/2011 Document Reviewed: 02/15/2007 [facility] Patient Information 2012 [employer].

## 2011-07-30 NOTE — Progress Notes (Signed)
Written and verbal d/c instructions given by Judson Roch RN and understanding voiced

## 2012-03-10 IMAGING — CT CT ABD-PELV W/ CM
2 of 4 series · 17 of 46 positions shown, 19 images · IV contrast (omnipaque)
Comparison: 04/23/2010.

CLINICAL DATA: 35-year-old female with lower abdominal pain greater
on the left.  Prior cholecystectomy.  Endometriosis.

CT ABDOMEN AND PELVIS WITH CONTRAST
TECHNIQUE: Multidetector CT imaging of the abdomen and pelvis was
performed following the standard protocol during bolus
administration of intravenous contrast.
Contrast: 100mL OMNIPAQUE IOHEXOL 300 MG/ML IV SOLN, 20mL OMNIPAQUE
IOHEXOL 300 MG/ML IV SOLN

[Series 2: abd/pelvis 5.0 b31f · axial · 0.94mm/px · z∈[+861,+1306]mm · 14 of 99 slices shown, 16 images]
[im 5/99  soft-tissue]
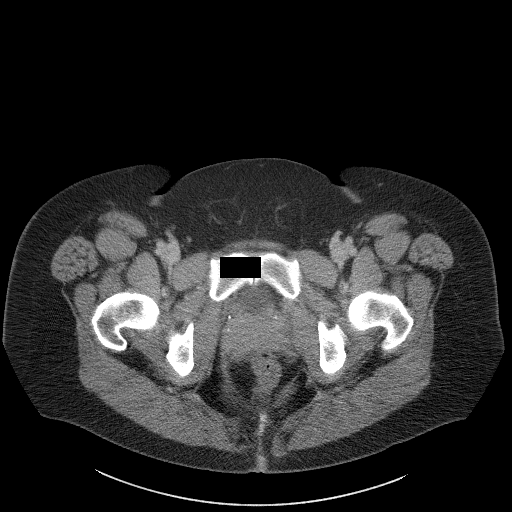
[im 5/99  bone]
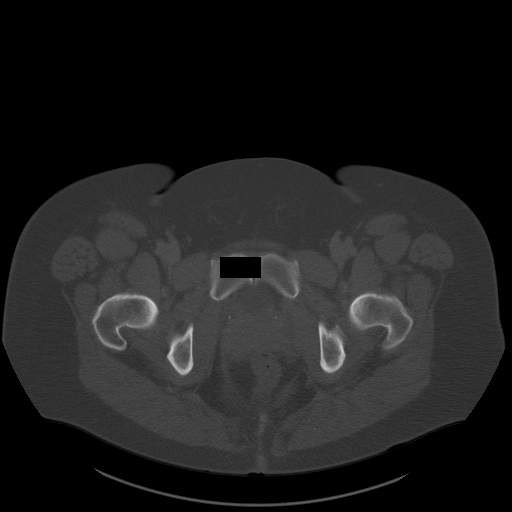
[im 13/99  soft-tissue]
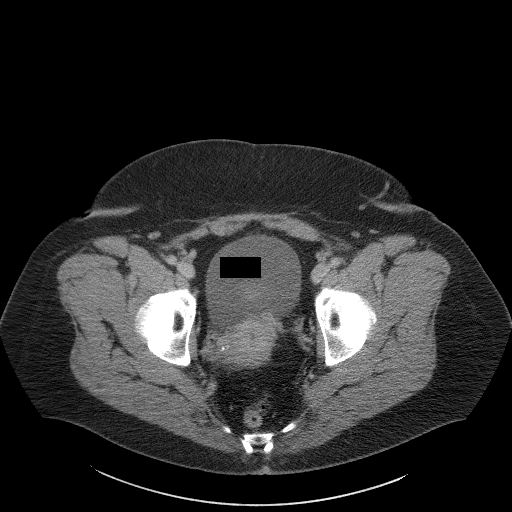
[im 21/99  soft-tissue]
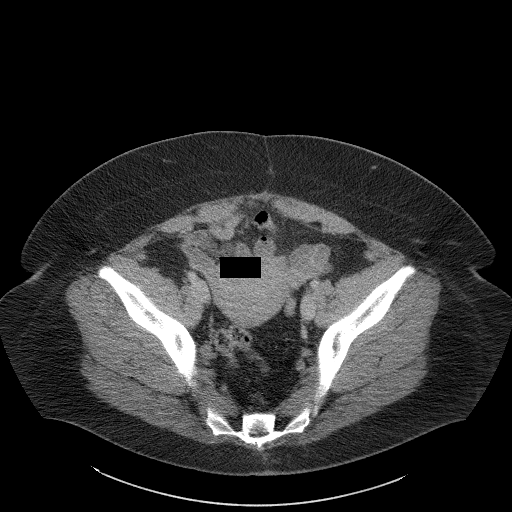
[im 25/99  soft-tissue]
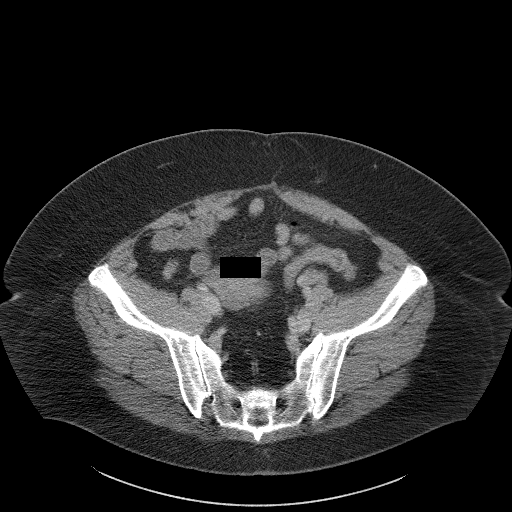
[im 33/99  soft-tissue]
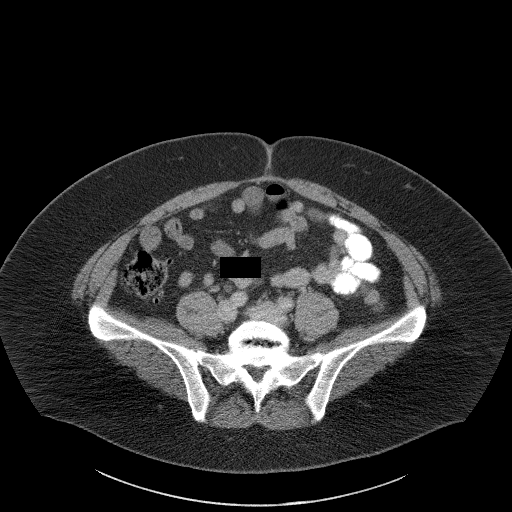
[im 41/99  soft-tissue]
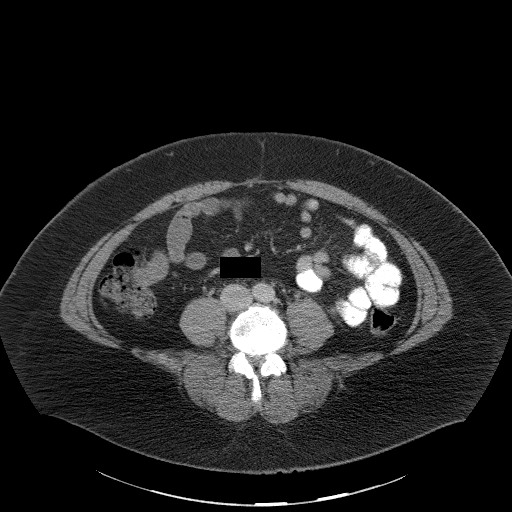
[im 45/99  soft-tissue]
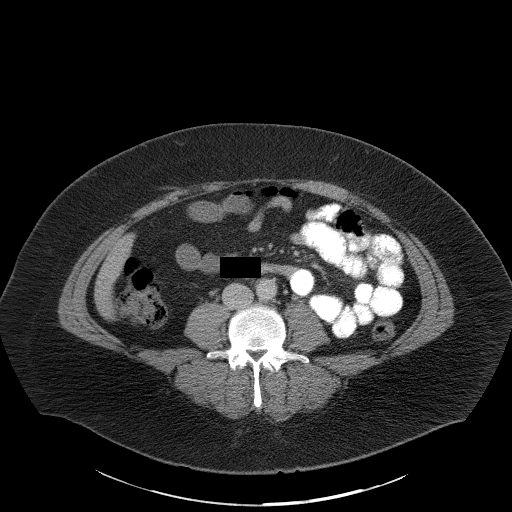
[im 54/99  soft-tissue]
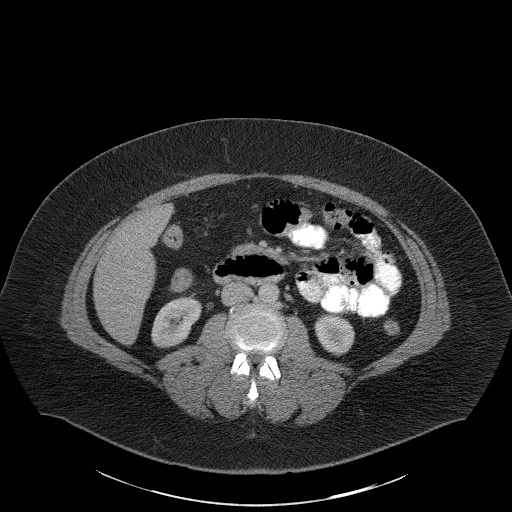
[im 58/99  soft-tissue]
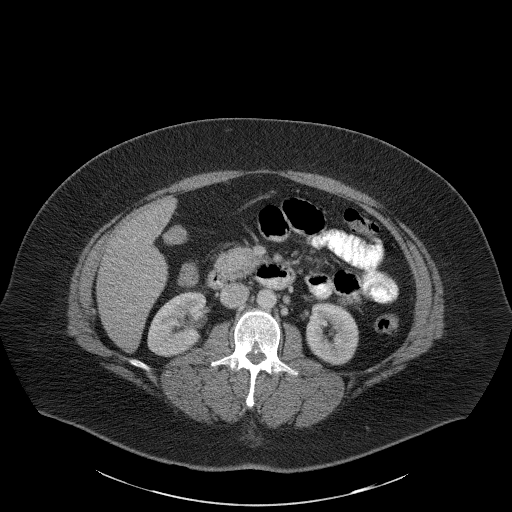
[im 58/99  bone]
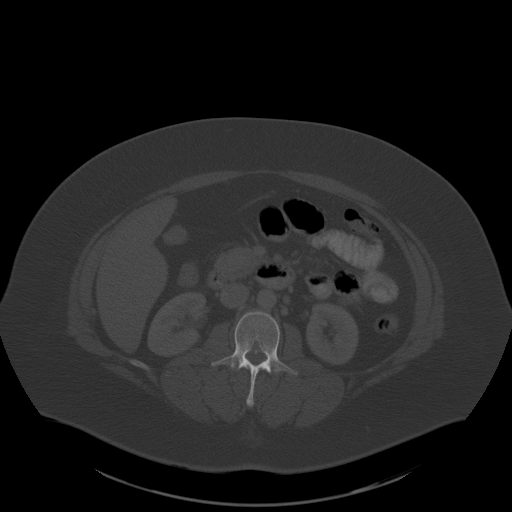
[im 66/99  soft-tissue]
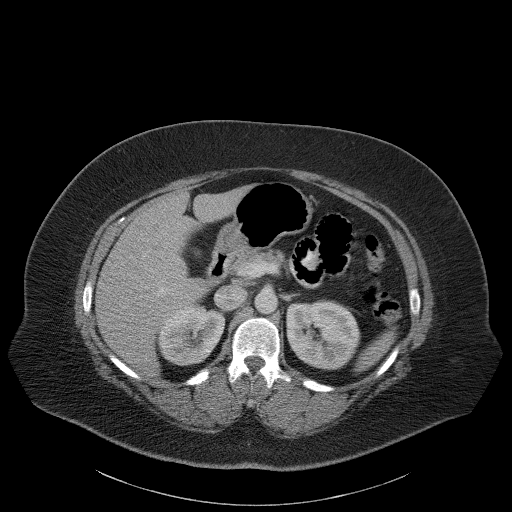
[im 74/99  soft-tissue]
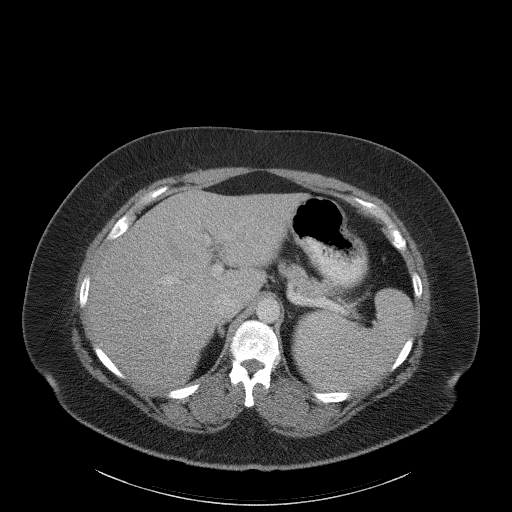
[im 78/99  soft-tissue]
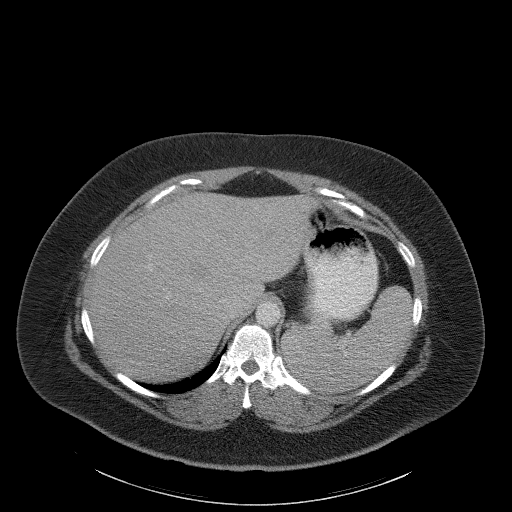
[im 86/99  soft-tissue]
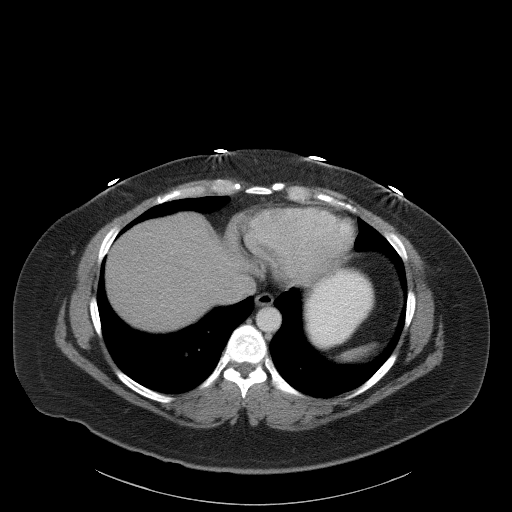
[im 94/99  soft-tissue]
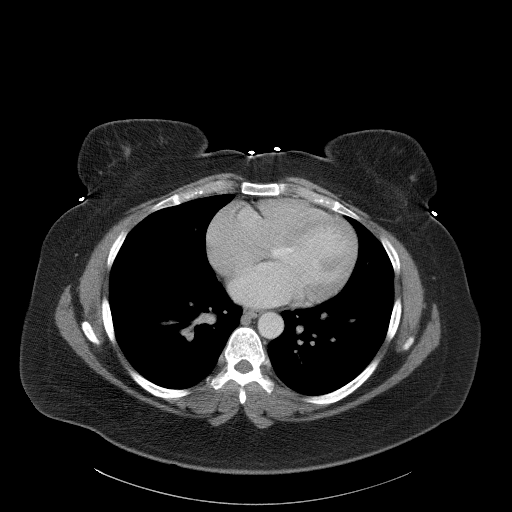

[Series 5: abd/pelvis 3.0 coronal · coronal · 0.99mm/px · 3 of 111 slices shown]
[im 37/111  soft-tissue]
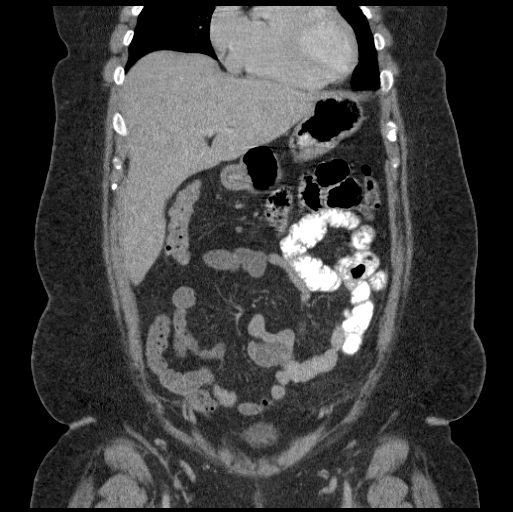
[im 49/111  soft-tissue]
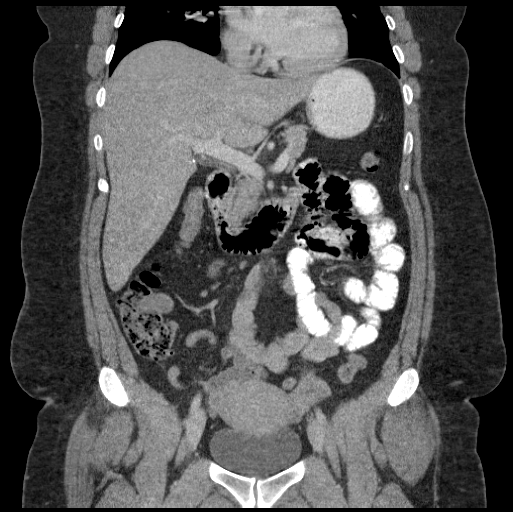
[im 62/111  soft-tissue]
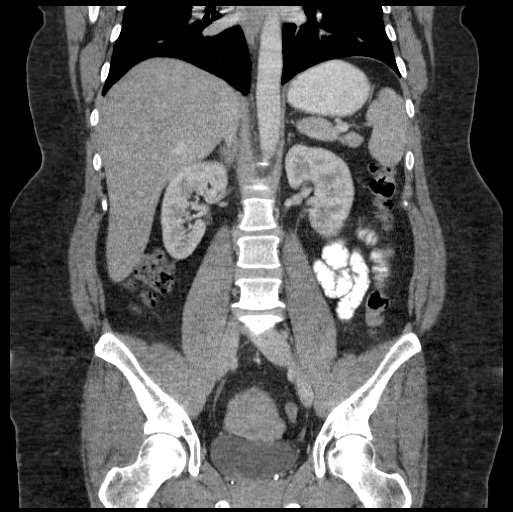

[17 of 46 positions shown; findings below may reference images not displayed]

FINDINGS: Clear lung bases.  Degenerative changes in the spine. No
acute osseous abnormality identified.

No pelvic free fluid.  Indistinct appearance of the lower uterine
segment and cervix, but no mass is evident.  Mild asymmetric
enlargement of the left adnexa is similar to the previous exam and
appears to be related to multiple small cysts.  Multiple
phleboliths.  Unremarkable bladder.  Negative distal colon.

Negative more proximal colon.  Normal appendix.  No dilated small
bowel.  Oral contrast has not yet reached the distal small bowel.
Negative stomach and duodenum.

Gallbladder is surgically absent.  Mild decreased density in the
liver compatible with a degree of fatty infiltration is stable.
Negative spleen, pancreas, adrenal glands, kidneys, portal venous
system, and major arterial structures.  No abdominal free fluid.
No lymphadenopathy.
IMPRESSION: No inflammatory findings in the abdomen or pelvis.
Multiple small left adnexal cysts, likely physiologic.

## 2013-01-23 ENCOUNTER — Encounter (HOSPITAL_COMMUNITY)
Admission: AD | Disposition: A | Discharge status: Home / Self Care | Payer: Self-pay | Source: Ambulatory Visit | Attending: Obstetrics and Gynecology

## 2013-01-23 ENCOUNTER — Inpatient Hospital Stay (HOSPITAL_COMMUNITY): Payer: BC Managed Care – PPO | Admitting: Anesthesiology

## 2013-01-23 ENCOUNTER — Encounter (HOSPITAL_COMMUNITY): Payer: Self-pay | Admitting: Anesthesiology

## 2013-01-23 ENCOUNTER — Encounter (HOSPITAL_COMMUNITY): Payer: BC Managed Care – PPO | Admitting: Anesthesiology

## 2013-01-23 ENCOUNTER — Inpatient Hospital Stay (HOSPITAL_COMMUNITY)
Admission: AD | Admit: 2013-01-23 | Discharge: 2013-01-23 | Disposition: A | Discharge status: Home / Self Care | Payer: BC Managed Care – PPO | Source: Ambulatory Visit | Attending: Obstetrics and Gynecology | Admitting: Obstetrics and Gynecology

## 2013-01-23 ENCOUNTER — Ambulatory Visit: Admit: 2013-01-23 | Payer: Self-pay | Admitting: Obstetrics and Gynecology

## 2013-01-23 DIAGNOSIS — N8300 Follicular cyst of ovary, unspecified side: Secondary | ICD-10-CM | POA: Insufficient documentation

## 2013-01-23 DIAGNOSIS — N83209 Unspecified ovarian cyst, unspecified side: Secondary | ICD-10-CM | POA: Insufficient documentation

## 2013-01-23 HISTORY — PX: LAPAROSCOPY: SHX197

## 2013-01-23 LAB — CBC
Hemoglobin: 13.7 g/dL (ref 12.0–15.0)
MCH: 30 pg (ref 26.0–34.0)
MCV: 87.5 fL (ref 78.0–100.0)
RBC: 4.56 MIL/uL (ref 3.87–5.11)
WBC: 11 10*3/uL — ABNORMAL HIGH (ref 4.0–10.5)

## 2013-01-23 LAB — COMPREHENSIVE METABOLIC PANEL
ALT: 89 U/L — ABNORMAL HIGH (ref 0–35)
AST: 84 U/L — ABNORMAL HIGH (ref 0–37)
CO2: 25 mEq/L (ref 19–32)
Calcium: 9.1 mg/dL (ref 8.4–10.5)
Chloride: 99 mEq/L (ref 96–112)
Creatinine, Ser: 0.65 mg/dL (ref 0.50–1.10)
GFR calc Af Amer: >90 mL/min (ref >90)
GFR calc non Af Amer: >90 mL/min (ref >90)
Glucose, Bld: 114 mg/dL — ABNORMAL HIGH (ref 70–99)
Total Bilirubin: 1 mg/dL (ref 0.3–1.2)

## 2013-01-23 SURGERY — LAPAROSCOPY OPERATIVE
Anesthesia: General | Site: Abdomen | Laterality: Right | Wound class: Clean Contaminated

## 2013-01-23 MED ORDER — ONDANSETRON HCL 4 MG/2ML IJ SOLN
INTRAMUSCULAR | Status: DC | PRN
  Administered 2013-01-23: 4 mg via INTRAMUSCULAR

## 2013-01-23 MED ORDER — ROCURONIUM BROMIDE 100 MG/10ML IV SOLN: INTRAVENOUS | Status: DC | PRN

## 2013-01-23 MED ORDER — BUPIVACAINE HCL (PF) 0.5 % IJ SOLN
INTRAMUSCULAR | Status: DC | PRN
  Administered 2013-01-23: 6 mL
  Administered 2013-01-23: 24 mL

## 2013-01-23 MED ORDER — NEOSTIGMINE METHYLSULFATE 1 MG/ML IJ SOLN
INTRAMUSCULAR | Status: AC
  Filled 2013-01-23: qty 1

## 2013-01-23 MED ORDER — LIDOCAINE HCL (CARDIAC) 20 MG/ML IV SOLN
INTRAVENOUS | Status: DC | PRN
  Administered 2013-01-23: 50 mg via INTRAVENOUS

## 2013-01-23 MED ORDER — PROPOFOL 10 MG/ML IV BOLUS
INTRAVENOUS | Status: DC | PRN
  Administered 2013-01-23: 200 mg via INTRAVENOUS

## 2013-01-23 MED ORDER — LACTATED RINGERS IV SOLN
INTRAVENOUS | Status: DC | PRN
  Administered 2013-01-23 (×2): via INTRAVENOUS

## 2013-01-23 MED ORDER — METOCLOPRAMIDE HCL 5 MG/ML IJ SOLN
INTRAMUSCULAR | Status: AC
  Filled 2013-01-23: qty 2

## 2013-01-23 MED ORDER — NEOSTIGMINE METHYLSULFATE 1 MG/ML IJ SOLN
INTRAMUSCULAR | Status: DC | PRN
  Administered 2013-01-23: 4 mg via INTRAVENOUS

## 2013-01-23 MED ORDER — PROPOFOL 10 MG/ML IV EMUL
INTRAVENOUS | Status: AC
  Filled 2013-01-23: qty 20

## 2013-01-23 MED ORDER — GLYCOPYRROLATE 0.2 MG/ML IJ SOLN
INTRAMUSCULAR | Status: AC
  Filled 2013-01-23: qty 2

## 2013-01-23 MED ORDER — ONDANSETRON HCL 4 MG/2ML IJ SOLN
INTRAMUSCULAR | Status: AC
  Filled 2013-01-23: qty 2

## 2013-01-23 MED ORDER — METOCLOPRAMIDE HCL 5 MG/ML IJ SOLN
10.0000 mg | Freq: Once | INTRAMUSCULAR | Status: AC | PRN
  Administered 2013-01-23: 10 mg via INTRAVENOUS

## 2013-01-23 MED ORDER — SCOPOLAMINE 1 MG/3DAYS TD PT72
MEDICATED_PATCH | TRANSDERMAL | Status: DC | PRN
  Administered 2013-01-23: 1 via TRANSDERMAL

## 2013-01-23 MED ORDER — GENTAMICIN SULFATE 40 MG/ML IJ SOLN: INTRAVENOUS | Status: DC

## 2013-01-23 MED ORDER — FENTANYL CITRATE 0.05 MG/ML IJ SOLN: 25.0000 ug | INTRAMUSCULAR | Status: DC | PRN

## 2013-01-23 MED ORDER — FENTANYL CITRATE 0.05 MG/ML IJ SOLN
INTRAMUSCULAR | Status: AC
  Filled 2013-01-23: qty 5

## 2013-01-23 MED ORDER — DEXAMETHASONE SODIUM PHOSPHATE 10 MG/ML IJ SOLN
INTRAMUSCULAR | Status: AC
  Filled 2013-01-23: qty 1

## 2013-01-23 MED ORDER — LIDOCAINE HCL (CARDIAC) 20 MG/ML IV SOLN
INTRAVENOUS | Status: AC
  Filled 2013-01-23: qty 5

## 2013-01-23 MED ORDER — MIDAZOLAM HCL 2 MG/2ML IJ SOLN
INTRAMUSCULAR | Status: AC
  Filled 2013-01-23: qty 2

## 2013-01-23 MED ORDER — GLYCOPYRROLATE 0.2 MG/ML IJ SOLN
INTRAMUSCULAR | Status: DC | PRN
  Administered 2013-01-23: 0.6 mg via INTRAVENOUS

## 2013-01-23 MED ORDER — MEPERIDINE HCL 25 MG/ML IJ SOLN
INTRAMUSCULAR | Status: AC
  Filled 2013-01-23: qty 1

## 2013-01-23 MED ORDER — DEXAMETHASONE SODIUM PHOSPHATE 4 MG/ML IJ SOLN
INTRAMUSCULAR | Status: DC | PRN
  Administered 2013-01-23: 10 mg via INTRAVENOUS

## 2013-01-23 MED ORDER — MEPERIDINE HCL 25 MG/ML IJ SOLN: 6.2500 mg | INTRAMUSCULAR | Status: DC | PRN

## 2013-01-23 MED ORDER — LACTATED RINGERS IV BOLUS (SEPSIS)
1000.0000 mL | Freq: Once | INTRAVENOUS | Status: AC
  Administered 2013-01-23: 1000 mL via INTRAVENOUS

## 2013-01-23 MED ORDER — FENTANYL CITRATE 0.05 MG/ML IJ SOLN
INTRAMUSCULAR | Status: DC | PRN
  Administered 2013-01-23 (×2): 100 ug via INTRAVENOUS
  Administered 2013-01-23: 50 ug via INTRAVENOUS

## 2013-01-23 MED ORDER — FAMOTIDINE IN NACL 20-0.9 MG/50ML-% IV SOLN
20.0000 mg | Freq: Once | INTRAVENOUS | Status: AC
  Administered 2013-01-23: 20 mg via INTRAVENOUS
  Filled 2013-01-23: qty 50

## 2013-01-23 MED ORDER — DIPHENHYDRAMINE HCL 50 MG/ML IJ SOLN
25.0000 mg | Freq: Once | INTRAMUSCULAR | Status: AC
  Administered 2013-01-23: 25 mg via INTRAVENOUS

## 2013-01-23 MED ORDER — DIPHENHYDRAMINE HCL 50 MG/ML IJ SOLN
INTRAMUSCULAR | Status: AC
  Filled 2013-01-23: qty 1

## 2013-01-23 MED ORDER — ROCURONIUM BROMIDE 100 MG/10ML IV SOLN
INTRAVENOUS | Status: DC | PRN
  Administered 2013-01-23: 5 mg via INTRAVENOUS
  Administered 2013-01-23: 20 mg via INTRAVENOUS

## 2013-01-23 MED ORDER — GENTAMICIN SULFATE 40 MG/ML IJ SOLN
INTRAVENOUS | Status: AC
  Administered 2013-01-23: 100 mL via INTRAVENOUS
  Filled 2013-01-23: qty 11.75

## 2013-01-23 MED ORDER — MIDAZOLAM HCL 5 MG/5ML IJ SOLN
INTRAMUSCULAR | Status: DC | PRN
  Administered 2013-01-23: 2 mg via INTRAVENOUS

## 2013-01-23 MED ORDER — LACTATED RINGERS IR SOLN
Status: DC | PRN
  Administered 2013-01-23: 3000 mL

## 2013-01-23 MED ORDER — SUCCINYLCHOLINE CHLORIDE 20 MG/ML IJ SOLN
INTRAMUSCULAR | Status: DC | PRN
  Administered 2013-01-23: 150 mg via INTRAVENOUS

## 2013-01-23 SURGICAL SUPPLY — 30 items
BARRIER ADHS 3X4 INTERCEED (GAUZE/BANDAGES/DRESSINGS) IMPLANT
CABLE HIGH FREQUENCY MONO STRZ (ELECTRODE) IMPLANT
CATH FOLEY 2WAY SLVR  5CC 16FR (CATHETERS) ×1
CATH FOLEY 2WAY SLVR 5CC 16FR (CATHETERS) ×1 IMPLANT
CATH ROBINSON RED A/P 14FR (CATHETERS) ×2 IMPLANT
CATH ROBINSON RED A/P 16FR (CATHETERS) ×2 IMPLANT
CLOTH BEACON ORANGE TIMEOUT ST (SAFETY) ×2 IMPLANT
DERMABOND ADVANCED (GAUZE/BANDAGES/DRESSINGS) ×1
DERMABOND ADVANCED .7 DNX12 (GAUZE/BANDAGES/DRESSINGS) ×1 IMPLANT
GLOVE BIO SURGEON STRL SZ7.5 (GLOVE) ×4 IMPLANT
GOWN PREVENTION PLUS LG XLONG (DISPOSABLE) ×2 IMPLANT
NEEDLE INSUFFLATION 120MM (ENDOMECHANICALS) ×2 IMPLANT
NS IRRIG 1000ML POUR BTL (IV SOLUTION) ×2 IMPLANT
PACK LAPAROSCOPY BASIN (CUSTOM PROCEDURE TRAY) ×2 IMPLANT
POUCH SPECIMEN RETRIEVAL 10MM (ENDOMECHANICALS) IMPLANT
PROTECTOR NERVE ULNAR (MISCELLANEOUS) ×2 IMPLANT
SEALER TISSUE G2 CVD JAW 35 (ENDOMECHANICALS) IMPLANT
SEALER TISSUE G2 CVD JAW 45CM (ENDOMECHANICALS) ×2 IMPLANT
SET IRRIG TUBING LAPAROSCOPIC (IRRIGATION / IRRIGATOR) ×2 IMPLANT
SLEEVE SCD COMPRESS KNEE MED (MISCELLANEOUS) ×2 IMPLANT
STRIP CLOSURE SKIN 1/4X3 (GAUZE/BANDAGES/DRESSINGS) IMPLANT
SUT VIC AB 3-0 PS2 18 (SUTURE)
SUT VIC AB 3-0 PS2 18XBRD (SUTURE) IMPLANT
SUT VICRYL 0 UR6 27IN ABS (SUTURE) ×2 IMPLANT
SYR 30ML LL (SYRINGE) ×2 IMPLANT
TOWEL OR 17X24 6PK STRL BLUE (TOWEL DISPOSABLE) ×4 IMPLANT
TROCAR XCEL NON-BLD 11X100MML (ENDOMECHANICALS) ×4 IMPLANT
TROCAR XCEL NON-BLD 5MMX100MML (ENDOMECHANICALS) ×2 IMPLANT
WARMER LAPAROSCOPE (MISCELLANEOUS) ×2 IMPLANT
WATER STERILE IRR 1000ML POUR (IV SOLUTION) IMPLANT

## 2013-01-23 NOTE — H&P (Signed)
Sheri Evans is an 37 y.o. female with RLQ pain all week. It got worse last pm and she went to [facility] ED at 4 am today. Was discharged home and called office. In my office today states RLQ pain continues. Some nausea. Records from [facility] ED show WBC of 9.1, hgb 14.2, CMET normal except glucose 114, normal UA. Abd/pelvic CT showed enlarged right adnexa with small volume of abdominal and pelvic ascites, normal appendix, no lymphadenopathy, no organomegaly. U/S at [facility] showed 7.4 x 5.4 x 4.6 cm right adnexal somplex. No normal ovarian tissue seen. There was some blood flow to area on doppler study. Patient discharged home and was told to come to my office. U/S in my office cannot identify any right ovarian tissue. There is a 7.1 x 3.5 x 7.4 cm right adnexal mass without distinct borders and no blood flow on doppler study.At [place] 1.5 years ago, the right ovary appeared normal. History of endometriosis, no active endometriosis seen at [facility].  Pertinent Gynecological History: Menses: N/A Bleeding: N/A Contraception: none DES exposure: denies Blood transfusions: none Sexually transmitted diseases: no past history Previous GYN Procedures: LAVH/LSO  Last mammogram: normal Date: 2014 Last pap: normal Date: 2014 OB History: G3, P3   Menstrual History: Menarche age: unknown  No LMP recorded.    Past Medical History  Diagnosis Date  . PONV (postoperative nausea and vomiting)   . Ovarian cyst   . Endometriosis     Past Surgical History  Procedure Laterality Date  . Cesarean section    . Abdominal hysterectomy    . Tonsillectomy    . Laporoscopy    . Gallbladder surgery      Family History  Problem Relation Age of Onset  . Anesthesia problems Neg Hx   . Hypotension Neg Hx   . Malignant hyperthermia Neg Hx   . Pseudochol deficiency Neg Hx     Social History:  reports that she has never smoked. She does not have any smokeless tobacco history on file. She reports that she  drinks alcohol. She reports that she does not use illicit drugs.  Allergies:  Allergies  Allergen Reactions  . Penicillins Anaphylaxis    Prescriptions prior to admission  Medication Sig Dispense Refill  . methocarbamol (ROBAXIN) 500 MG tablet Take 500 mg by mouth every 8 (eight) hours.      Marland Kitchen oxyCODONE-acetaminophen (PERCOCET) 10-325 MG per tablet Take 1-2 tablets by mouth every 4 (four) hours as needed. For pain        Review of Systems  Gastrointestinal: Positive for nausea, vomiting and abdominal pain.       N/V today RLQ pain    There were no vitals taken for this visit. Physical Exam  Cardiovascular: Normal rate and regular rhythm.   Respiratory: Effort normal and breath sounds normal.  GI: Soft. There is tenderness.  Moderate RLQ tenderness without rebound  Genitourinary:  Right adnexal tenderness Unable to palpate a distinct mass    No results found for this or any previous visit (from the past 24 hour(s)).  No results found.  Assessment/Plan: 37 yo G3P3 S/P LAVH/LSO with right adnexal mass  D/W patient and husband above, suspicous for right ovarian torsion due to no flow on doppler. D/W possible abcess, possible malignancy. In view of no blood flow, pain-I recommend L/S, possible laparotomy, possible RSO. D/W risks-infection, organ damage, bleeding/transfusion-HIV/Hep, DVT/PE, pneumonia, pelvic pain.  Sheri Evans,Sheri Evans 01/23/2013, 6:28 PM

## 2013-01-23 NOTE — Anesthesia Postprocedure Evaluation (Signed)
  Anesthesia Post-op Note  Patient: Sheri Evans  Procedure(s) Performed: Procedure(s): LAPAROSCOPY OPERATIVE, Right Ovarian Cystectomy, Lysis of Adhesions (Right)  Patient Location: PACU  Anesthesia Type:General  Level of Consciousness: awake, alert  and oriented  Airway and Oxygen Therapy: Patient Spontanous Breathing  Post-op Pain: mild  Post-op Assessment: Post-op Vital signs reviewed, Patient's Cardiovascular Status Stable, Respiratory Function Stable, Patent Airway, NAUSEA AND VOMITING PRESENT and Pain level controlled  Post-op Vital Signs: Reviewed and stable  Complications: No apparent anesthesia complications

## 2013-01-23 NOTE — Brief Op Note (Signed)
01/23/2013  8:39 PM  PATIENT:  Graylin Shiver  37 y.o. female  PRE-OPERATIVE DIAGNOSIS:  Complex Right Adnexal Mass  POST-OPERATIVE DIAGNOSIS:  Complex Right Adnexal Mass  PROCEDURE:  Procedure(s): LAPAROSCOPY OPERATIVE, Right Ovarian Cystectomy, Lysis of Adhesions (Right)  SURGEON:  Surgeon(s) and Role:    * Allena Katz, MD - Primary  PHYSICIAN ASSISTANT:   ASSISTANTS: none   ANESTHESIA:   general  EBL:  Total I/O In: -  Out: 75 [Urine:75]  BLOOD ADMINISTERED:none  DRAINS: none   LOCAL MEDICATIONS USED:  MARCAINE     SPECIMEN:  Source of Specimen:  right ovarian cyst  DISPOSITION OF SPECIMEN:  PATHOLOGY  COUNTS:  YES  TOURNIQUET:  * No tourniquets in log *  DICTATION: .Other Dictation: Dictation Number [id number]  PLAN OF CARE: Discharge to home after PACU  PATIENT DISPOSITION:  PACU - hemodynamically stable.   Delay start of Pharmacological VTE agent (>24hrs) due to surgical blood loss or risk of bleeding: not applicable

## 2013-01-23 NOTE — Anesthesia Preprocedure Evaluation (Signed)
Anesthesia Evaluation  Patient identified by MRN, date of birth, ID band Patient awake    Reviewed: Allergy & Precautions, H&P , NPO status , Patient's Chart, lab work & pertinent test results, reviewed documented beta blocker date and time   History of Anesthesia Complications (+) PONV and history of anesthetic complications  Airway Mallampati: III TM Distance: >3 FB Neck ROM: full    Dental  (+)    Pulmonary neg pulmonary ROS,  breath sounds clear to auscultation  Pulmonary exam normal       Cardiovascular negative cardio ROS  Rhythm:regular Rate:Normal     Neuro/Psych negative neurological ROS  negative psych ROS   GI/Hepatic negative GI ROS, Neg liver ROS,   Endo/Other  Morbid obesity  Renal/GU negative Renal ROS  Female GU complaint     Musculoskeletal   Abdominal   Peds  Hematology negative hematology ROS (+)   Anesthesia Other Findings NPO since midnight. Has been in [facility] all day.  Received dilaudid and zofran (per pt).  Vomiting all day.  Reproductive/Obstetrics negative OB ROS                           Anesthesia Physical Anesthesia Plan  ASA: III and emergent  Anesthesia Plan: General ETT   Post-op Pain Management:    Induction:   Airway Management Planned:   Additional Equipment:   Intra-op Plan:   Post-operative Plan:   Informed Consent: I have reviewed the patients History and Physical, chart, labs and discussed the procedure including the risks, benefits and alternatives for the proposed anesthesia with the patient or authorized representative who has indicated his/her understanding and acceptance.   Dental Advisory Given  Plan Discussed with: CRNA and Surgeon  Anesthesia Plan Comments:         Anesthesia Quick Evaluation

## 2013-01-23 NOTE — Transfer of Care (Signed)
Immediate Anesthesia Transfer of Care Note  Patient: Sheri Evans  Procedure(s) Performed: Procedure(s): LAPAROSCOPY OPERATIVE, Right Ovarian Cystectomy, Lysis of Adhesions (Right)  Patient Location: PACU  Anesthesia Type:General  Level of Consciousness: awake, alert , oriented and patient cooperative  Airway & Oxygen Therapy: Patient Spontanous Breathing and Patient connected to nasal cannula oxygen  Post-op Assessment: Report given to PACU RN, Post -op Vital signs reviewed and stable and Patient moving all extremities X 4  Post vital signs: Reviewed and stable  Complications: No apparent anesthesia complications

## 2013-01-23 NOTE — MAU Note (Signed)
Patient presents to MAU for add on surgery for abdominal mass found at ER visit at [facility].

## 2013-01-24 ENCOUNTER — Encounter (HOSPITAL_COMMUNITY): Payer: Self-pay | Admitting: Obstetrics and Gynecology

## 2013-01-24 NOTE — Op Note (Signed)
Sheri Evans, Sheri Evans            ACCOUNT NO.:  [id number]  MEDICAL RECORD NO.:  [id number]  LOCATION:  [facility]                          FACILITY:  [facility]  PHYSICIAN:  Daleen Bo. Gaetano Net, M.D. DATE OF BIRTH:  11/18/1975  DATE OF PROCEDURE:  01/23/2013 DATE OF DISCHARGE:  01/23/2013                              OPERATIVE REPORT   PREOPERATIVE DIAGNOSIS:  Right adnexal mass.  POSTOPERATIVE DIAGNOSIS:  Right adnexal mass.  PROCEDURE:  Laparoscopy with right ovarian cystectomy and lysis of adhesions.  SURGEON:  Daleen Bo. Gaetano Net, M.D.  ANESTHESIA:  General endotracheal intubation, Yvetta Coder, MD.  SPECIMENS:  Right ovarian cyst to Pathology.  ESTIMATED BLOOD LOSS:  300 mL.  INDICATIONS AND CONSENT:  This patient is a 37 year old married white female, G3, P3, status post LAVH LSO approximately 1-1/2 years ago.  At that time, the right ovary appeared normal.  She describes a week or so of increasingly severe right lower quadrant pain.  It got bad last night and at approximately 4 a.m. today, she presented to the [facility] Emergency Department.  While there, she was noted to have a white count of approximately 9.1, hemoglobin of 14.2, negative urine.  CAT scan showed approximately 7-cm right adnexal nebulous mass.  There was no lymphadenopathy, no organomegaly, and a small amount of ascites.  An ultrasound there described a small amount of flow to an again difficult to define right adnexal mass with no normal ovarian tissue noted.  She was discharged home and told to follow up in my office.  We asked her to come by immediately.  Ultrasound in my office again revealed an approximately 6 x 8 cm ill-defined right adnexal mass with no distinct borders.  There was some free fluid in the pelvis.  We could not see any normal ovarian tissue and there was no blood flow to that area on Doppler.  The patient continued to have significant right lower quadrant pain.  Laparoscopy, possible  laparotomy, possible right salpingo- oophorectomy was discussed with the patient.  Potential risks and complications were reviewed preoperatively including, but not limited to, infection, organ damage, bleeding requiring transfusion of blood products with HIV and hepatitis acquisition, DVT, PE, pneumonia, laparotomy, postoperative pain, pain with intercourse.  Possible malignancy and abscess were also reviewed.  All questions were answered and consent was signed on the chart.  FINDINGS:  Upper abdomen was grossly normal.  There was approximately 250-300 mL of blood and clot in the pelvis.  The right ovary has a ruptured 3-cm cyst on 1 pole.  The remainder of the ovary appears normal.  The ovarian pedicle appears normal with no evidence of twisting.  The right fallopian tube is also in its normal relationship to the ovary and also is not involved in any kind of torsion.  PROCEDURE:  The patient was taken to the operating room, where she was identified and placed in dorsal supine position and general anesthesia was induced via endotracheal intubation.  She was placed in a dorsal lithotomy position.  Time-out was undertaken.  She was prepped abdominally and vaginally.  Bladder straight catheterized.  Sponge on a stick was placed in the vagina and she was draped in a  sterile fashion. The infraumbilical and suprapubic areas were injected in the midline with approximately 6 mL of 0.5% plain Marcaine.  Small infraumbilical incision was made and disposable Veress needle was placed on the first attempt without difficulty.  A good syringe and drop test were noted.  2 L of gas were then insufflated under low pressure with good tympany in the right upper quadrant.  Veress needle was removed and a 10/11 XL bladeless disposable trocar sleeve was placed using direct visualization with the diagnostic laparoscope.  After placement, the operative scope was used.  A small suprapubic incision was made in  the midline and a 5- mm XL bladeless disposable trocar sleeve was placed under direct visualization without difficulty.  The above findings were noted.  The blood was suctioned to remove the clot.  The suprapubic trocar sleeve was removed, the skin incision was extended slightly, and a 10-mm XL bladeless disposable trocar sleeve was inserted suprapubically under direct visualization.  This allows the larger pool sucker to be used. Under very careful observation to avoid bowel, the clot is aspirated. Then further irrigation and suction were used with again the normal irrigation tip.  Careful inspection again reveals no evidence of torsion.  There is no continued bleeding from the cyst.  There are closed loop formed with adhesions to the lower anterior abdominal wall from the epiploicae of the bowel.  This was taken down under good visualization with the EnSeal bipolar cautery cutting instrument.  Good hemostasis was noted.  There was still some adhesions of the epiploicae to the vaginal cuff.  However, they are quite dense and did not form any closed loops and therefore left alone.  The EnSeal device was also used to resect the cyst from the right ovary.  Bipolar cautery with the Kleppinger was then used under good visualization to cauterize the raw area of the ovary and obtained good hemostasis.  Reduced pneumoperitoneum was used and inspection revealed good hemostasis. Copious irrigation was carried out and all excess fluid and all blood was aspirated.  The remaining approximately 24 mL of 0.5% plain Marcaine was instilled into the peritoneal cavity.  Suprapubic trocar sleeve was removed, pneumoperitoneum was reduced, and the umbilical trocar sleeve was removed.  It should be noted that the cyst that was resected from the ovary was removed from the abdomen intact without difficulty and was sent to Pathology.  0-Vicryl was used to close the deeper layers of the umbilical incision that  could be well visualized.  Both skin incisions were then closed with interrupted 3-0 Vicryl.  Dermabond was placed on both incisions as well.  The sponge on a stick was removed from the vagina.  All counts correct.  The patient was awakened, taken to the recovery room in stable condition.     Daleen Bo Gaetano Net, M.D.     JET/MEDQ  D:  01/23/2013  T:  01/24/2013  Job:  [id number]

## 2013-07-27 ENCOUNTER — Emergency Department (HOSPITAL_BASED_OUTPATIENT_CLINIC_OR_DEPARTMENT_OTHER): Payer: BC Managed Care – PPO

## 2013-07-27 ENCOUNTER — Emergency Department (HOSPITAL_BASED_OUTPATIENT_CLINIC_OR_DEPARTMENT_OTHER)
Admission: EM | Admit: 2013-07-27 | Discharge: 2013-07-28 | Disposition: A | Discharge status: Home / Self Care | Payer: BC Managed Care – PPO | Attending: Emergency Medicine | Admitting: Emergency Medicine

## 2013-07-27 ENCOUNTER — Encounter (HOSPITAL_BASED_OUTPATIENT_CLINIC_OR_DEPARTMENT_OTHER): Payer: Self-pay | Admitting: Emergency Medicine

## 2013-07-27 DIAGNOSIS — R630 Anorexia: Secondary | ICD-10-CM | POA: Insufficient documentation

## 2013-07-27 DIAGNOSIS — Z8742 Personal history of other diseases of the female genital tract: Secondary | ICD-10-CM | POA: Insufficient documentation

## 2013-07-27 DIAGNOSIS — M545 Low back pain, unspecified: Secondary | ICD-10-CM | POA: Insufficient documentation

## 2013-07-27 DIAGNOSIS — Z792 Long term (current) use of antibiotics: Secondary | ICD-10-CM | POA: Insufficient documentation

## 2013-07-27 DIAGNOSIS — IMO0002 Reserved for concepts with insufficient information to code with codable children: Secondary | ICD-10-CM

## 2013-07-27 DIAGNOSIS — R109 Unspecified abdominal pain: Secondary | ICD-10-CM

## 2013-07-27 DIAGNOSIS — Z880 Allergy status to penicillin: Secondary | ICD-10-CM | POA: Insufficient documentation

## 2013-07-27 DIAGNOSIS — Z90710 Acquired absence of both cervix and uterus: Secondary | ICD-10-CM | POA: Insufficient documentation

## 2013-07-27 DIAGNOSIS — M549 Dorsalgia, unspecified: Secondary | ICD-10-CM

## 2013-07-27 DIAGNOSIS — R1031 Right lower quadrant pain: Secondary | ICD-10-CM | POA: Insufficient documentation

## 2013-07-27 LAB — COMPREHENSIVE METABOLIC PANEL
ALBUMIN: 4.1 g/dL (ref 3.5–5.2)
ALT: 24 U/L (ref 0–35)
AST: 20 U/L (ref 0–37)
Alkaline Phosphatase: 65 U/L (ref 39–117)
BUN: 15 mg/dL (ref 6–23)
CALCIUM: 9.9 mg/dL (ref 8.4–10.5)
CO2: 29 meq/L (ref 19–32)
Chloride: 100 mEq/L (ref 96–112)
Creatinine, Ser: 0.9 mg/dL (ref 0.50–1.10)
GFR calc Af Amer: >90 mL/min (ref >90)
GFR, EST NON AFRICAN AMERICAN: 80 mL/min — AB (ref >90)
Glucose, Bld: 114 mg/dL — ABNORMAL HIGH (ref 70–99)
Potassium: 3.9 mEq/L (ref 3.7–5.3)
SODIUM: 141 meq/L (ref 137–147)
Total Bilirubin: 0.6 mg/dL (ref 0.3–1.2)
Total Protein: 7.3 g/dL (ref 6.0–8.3)

## 2013-07-27 LAB — URINALYSIS, ROUTINE W REFLEX MICROSCOPIC
Bilirubin Urine: NEGATIVE
GLUCOSE, UA: NEGATIVE mg/dL
Hgb urine dipstick: NEGATIVE
Ketones, ur: NEGATIVE mg/dL
LEUKOCYTES UA: NEGATIVE
NITRITE: NEGATIVE
PROTEIN: NEGATIVE mg/dL
Specific Gravity, Urine: 1.005 (ref 1.005–1.030)
UROBILINOGEN UA: 0.2 mg/dL (ref 0.0–1.0)
pH: 6.5 (ref 5.0–8.0)

## 2013-07-27 LAB — CBC WITH DIFFERENTIAL/PLATELET
BASOS ABS: 0 10*3/uL (ref 0.0–0.1)
BASOS PCT: 0 % (ref 0–1)
Eosinophils Absolute: 0.1 10*3/uL (ref 0.0–0.7)
Eosinophils Relative: 1 % (ref 0–5)
HCT: 42.5 % (ref 36.0–46.0)
Hemoglobin: 14.6 g/dL (ref 12.0–15.0)
Lymphocytes Relative: 30 % (ref 12–46)
Lymphs Abs: 2.7 10*3/uL (ref 0.7–4.0)
MCH: 30.7 pg (ref 26.0–34.0)
MCHC: 34.4 g/dL (ref 30.0–36.0)
MCV: 89.3 fL (ref 78.0–100.0)
Monocytes Absolute: 0.8 10*3/uL (ref 0.1–1.0)
Monocytes Relative: 9 % (ref 3–12)
NEUTROS ABS: 5.3 10*3/uL (ref 1.7–7.7)
Neutrophils Relative %: 60 % (ref 43–77)
PLATELETS: 225 10*3/uL (ref 150–400)
RBC: 4.76 MIL/uL (ref 3.87–5.11)
RDW: 13.1 % (ref 11.5–15.5)
WBC: 8.8 10*3/uL (ref 4.0–10.5)

## 2013-07-27 MED ORDER — HYDROMORPHONE HCL PF 1 MG/ML IJ SOLN
1.0000 mg | Freq: Once | INTRAMUSCULAR | Status: AC
  Administered 2013-07-27: 1 mg via INTRAVENOUS
  Filled 2013-07-27: qty 1

## 2013-07-27 MED ORDER — ONDANSETRON HCL 4 MG PO TABS: 4.0000 mg | ORAL_TABLET | Freq: Four times a day (QID) | ORAL | Status: DC

## 2013-07-27 MED ORDER — IOHEXOL 300 MG/ML  SOLN: 100.0000 mL | Freq: Once | INTRAMUSCULAR | Status: AC | PRN

## 2013-07-27 MED ORDER — SODIUM CHLORIDE 0.9 % IV SOLN
Freq: Once | INTRAVENOUS | Status: AC
  Administered 2013-07-27: 22:00:00 via INTRAVENOUS

## 2013-07-27 MED ORDER — ONDANSETRON HCL 4 MG/2ML IJ SOLN
4.0000 mg | Freq: Once | INTRAMUSCULAR | Status: AC
  Administered 2013-07-27: 4 mg via INTRAVENOUS
  Filled 2013-07-27: qty 2

## 2013-07-27 MED ORDER — IOHEXOL 300 MG/ML  SOLN
50.0000 mL | Freq: Once | INTRAMUSCULAR | Status: AC | PRN
  Administered 2013-07-27: 50 mL via ORAL

## 2013-07-27 MED ORDER — OXYCODONE-ACETAMINOPHEN 5-325 MG PO TABS: 1.0000 | ORAL_TABLET | ORAL | Status: DC | PRN

## 2013-07-27 NOTE — ED Provider Notes (Signed)
CSN: [id number]     Arrival date & time 07/27/13  2034 History   First MD Initiated Contact with Patient 07/27/13 2123     Chief Complaint  Patient presents with  . Back Pain     (Consider location/radiation/quality/duration/timing/severity/associated sxs/prior Treatment) Patient is a 38 y.o. female presenting with back pain. The history is provided by the patient. No language interpreter was used.  Back Pain Location:  Lumbar spine Quality:  Aching Pain severity:  Moderate Onset quality:  Gradual Duration:  5 days Associated symptoms: abdominal pain   Associated symptoms: no chest pain, no dysuria and no fever   Associated symptoms comment:  The patient has had lower back pain for the past 5 days. Over the last 1-2 days, the pain has localized to the RLQ abdomen. She has had mild anorexia today. No known fever. She denies dysuria or vomiting. Normal frequency of stool today that is loose. She states she had a partial hysterectomy with the right ovary remaining and that she has had ovarian cysts previously. She states that the current pain is different than with the ovarian cyst at that time.    Past Medical History  Diagnosis Date  . PONV (postoperative nausea and vomiting)   . Ovarian cyst   . Endometriosis    Past Surgical History  Procedure Laterality Date  . Cesarean section    . Abdominal hysterectomy    . Tonsillectomy    . Laporoscopy    . Gallbladder surgery    . Laparoscopy Right 01/23/2013    Procedure: LAPAROSCOPY OPERATIVE, Right Ovarian Cystectomy, Lysis of Adhesions;  Surgeon: Allena Katz, MD;  Location: [facility];  Service: Gynecology;  Laterality: Right;   Family History  Problem Relation Age of Onset  . Anesthesia problems Neg Hx   . Hypotension Neg Hx   . Malignant hyperthermia Neg Hx   . Pseudochol deficiency Neg Hx    History  Substance Use Topics  . Smoking status: Never Smoker   . Smokeless tobacco: Not on file  . Alcohol Use: Yes      Comment: occ   OB History   Grav Para Term Preterm Abortions TAB SAB Ect Mult Living   3 3 3  0 0 0 0 0 0 3     Review of Systems  Constitutional: Positive for appetite change. Negative for fever.  Respiratory: Negative for shortness of breath.   Cardiovascular: Negative for chest pain.  Gastrointestinal: Positive for abdominal pain. Negative for vomiting and blood in stool.  Genitourinary: Negative for dysuria.  Musculoskeletal: Positive for back pain.      Allergies  Penicillins  Home Medications   Prior to Admission medications   Medication Sig Start Date End Date Taking? Authorizing Provider  nitrofurantoin, macrocrystal-monohydrate, (MACROBID) 100 MG capsule Take 100 mg by mouth 2 (two) times daily.   Yes Historical Provider, MD  methocarbamol (ROBAXIN) 500 MG tablet Take 500 mg by mouth every 8 (eight) hours.    Historical Provider, MD  oxyCODONE-acetaminophen (PERCOCET) 10-325 MG per tablet Take 1-2 tablets by mouth every 4 (four) hours as needed. For pain    Historical Provider, MD   BP 148/85  Pulse 88  Temp(Src) 98.5 F (36.9 C) (Oral)  Resp 20  SpO2 99%  LMP 05/01/2011 Physical Exam  Constitutional: She is oriented to person, place, and time. She appears well-developed and well-nourished.  HENT:  Head: Normocephalic.  Neck: Normal range of motion. Neck supple.  Cardiovascular: Normal rate and regular  rhythm.   Pulmonary/Chest: Effort normal and breath sounds normal.  Abdominal: Soft. Bowel sounds are normal. There is tenderness. There is no rebound and no guarding.  RLQ abdominal tenderness with mild rebound. No guarding. BS active.   Genitourinary:  No CVA tenderness.   Musculoskeletal: Normal range of motion.  Neurological: She is alert and oriented to person, place, and time.  Skin: Skin is warm and dry. No rash noted.  Psychiatric: She has a normal mood and affect.    ED Course  Procedures (including critical care time) Labs Review Labs Reviewed   COMPREHENSIVE METABOLIC PANEL - Abnormal; Notable for the following:    Glucose, Bld 114 (*)    GFR calc non Af Amer 80 (*)    All other components within normal limits  URINALYSIS, ROUTINE W REFLEX MICROSCOPIC  CBC WITH DIFFERENTIAL   Results for orders placed during the hospital encounter of 07/27/13  URINALYSIS, ROUTINE W REFLEX MICROSCOPIC      Result Value Ref Range   Color, Urine YELLOW  YELLOW   APPearance CLEAR  CLEAR   Specific Gravity, Urine 1.005  1.005 - 1.030   pH 6.5  5.0 - 8.0   Glucose, UA NEGATIVE  NEGATIVE mg/dL   Hgb urine dipstick NEGATIVE  NEGATIVE   Bilirubin Urine NEGATIVE  NEGATIVE   Ketones, ur NEGATIVE  NEGATIVE mg/dL   Protein, ur NEGATIVE  NEGATIVE mg/dL   Urobilinogen, UA 0.2  0.0 - 1.0 mg/dL   Nitrite NEGATIVE  NEGATIVE   Leukocytes, UA NEGATIVE  NEGATIVE  CBC WITH DIFFERENTIAL      Result Value Ref Range   WBC 8.8  4.0 - 10.5 K/uL   RBC 4.76  3.87 - 5.11 MIL/uL   Hemoglobin 14.6  12.0 - 15.0 g/dL   HCT 42.5  36.0 - 46.0 %   MCV 89.3  78.0 - 100.0 fL   MCH 30.7  26.0 - 34.0 pg   MCHC 34.4  30.0 - 36.0 g/dL   RDW 13.1  11.5 - 15.5 %   Platelets 225  150 - 400 K/uL   Neutrophils Relative % 60  43 - 77 %   Neutro Abs 5.3  1.7 - 7.7 K/uL   Lymphocytes Relative 30  12 - 46 %   Lymphs Abs 2.7  0.7 - 4.0 K/uL   Monocytes Relative 9  3 - 12 %   Monocytes Absolute 0.8  0.1 - 1.0 K/uL   Eosinophils Relative 1  0 - 5 %   Eosinophils Absolute 0.1  0.0 - 0.7 K/uL   Basophils Relative 0  0 - 1 %   Basophils Absolute 0.0  0.0 - 0.1 K/uL  COMPREHENSIVE METABOLIC PANEL      Result Value Ref Range   Sodium 141  137 - 147 mEq/L   Potassium 3.9  3.7 - 5.3 mEq/L   Chloride 100  96 - 112 mEq/L   CO2 29  19 - 32 mEq/L   Glucose, Bld 114 (*) 70 - 99 mg/dL   BUN 15  6 - 23 mg/dL   Creatinine, Ser 0.90  0.50 - 1.10 mg/dL   Calcium 9.9  8.4 - 10.5 mg/dL   Total Protein 7.3  6.0 - 8.3 g/dL   Albumin 4.1  3.5 - 5.2 g/dL   AST 20  0 - 37 U/L   ALT 24  0 - 35  U/L   Alkaline Phosphatase 65  39 - 117 U/L   Total Bilirubin 0.6  0.3 - 1.2 mg/dL   GFR calc non Af Amer 80 (*) >90 mL/min   GFR calc Af Amer >90  >90 mL/min   Ct Abdomen Pelvis W Contrast  07/27/2013   CLINICAL DATA:  Lower back pain and abdominal pain.  EXAM: CT ABDOMEN AND PELVIS WITH CONTRAST  TECHNIQUE: Multidetector CT imaging of the abdomen and pelvis was performed using the standard protocol following bolus administration of intravenous contrast.  CONTRAST:  100 mL of Omnipaque 300 IV contrast  COMPARISON:  CT of the abdomen and pelvis from 05/12/2011  FINDINGS: The visualized lung bases are clear.  The liver and spleen are unremarkable in appearance. The patient is status post cholecystectomy, with clips noted along the gallbladder fossa. The pancreas and adrenal glands are unremarkable.  The kidneys are unremarkable in appearance. There is no evidence of hydronephrosis. No renal or ureteral stones are seen. No perinephric stranding is appreciated.  No free fluid is identified. The small bowel is unremarkable in appearance. The stomach is within normal limits. No acute vascular abnormalities are seen.  The appendix is normal in caliber, without evidence for appendicitis. The colon is unremarkable in appearance.  The bladder is mildly distended and grossly unremarkable in appearance. The patient is status post hysterectomy. No suspicious adnexal masses are seen. The right ovary is unremarkable in appearance. No inguinal lymphadenopathy is seen.  No acute osseous abnormalities are identified. Vacuum phenomenon is noted along the lower lumbar spine, with stable mild chronic bony foraminal narrowing at L4-L5 and L5-S1 bilaterally.  IMPRESSION: 1. No acute abnormality seen within the abdomen or pelvis. 2. 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   Electronically Signed   By: Garald Balding M.D.   On: 07/27/2013 23:40   Imaging Review No results  found.   EKG Interpretation None      MDM   Final diagnoses:  None    1. Back pain 2. Abdominal pain  Pain is improved with medications. VSS. Labs are reassuring. CT demonstrates normal appendix and normal right ovary. Patient is stable for discharge and is encouraged to recheck with PCP in 2-3 days, and to return with any worsening symptoms or new concern.    Dewaine Oats, PA-C 07/27/13 2354

## 2013-07-27 NOTE — ED Notes (Signed)
Assumed care of pt from [place], [place]. Pt lying on stretcher resting quietly at this time. Husband at bedside. Stretcher locked and  in lowest position. SR up x2.

## 2013-07-27 NOTE — Discharge Instructions (Signed)
Abdominal Pain, Adult °Many things can cause abdominal pain. Usually, abdominal pain is not caused by a disease and will improve without treatment. It can often be observed and treated at home. Your health care provider will do a physical exam and possibly order blood tests and X-rays to help determine the seriousness of your pain. However, in many cases, more time must pass before a clear cause of the pain can be found. Before that point, your health care provider may not know if you need more testing or further treatment. °HOME CARE INSTRUCTIONS  °Monitor your abdominal pain for any changes. The following actions may help to alleviate any discomfort you are experiencing: °· Only take over-the-counter or prescription medicines as directed by your health care provider. °· Do not take laxatives unless directed to do so by your health care provider. °· Try a clear liquid diet (broth, tea, or water) as directed by your health care provider. Slowly move to a bland diet as tolerated. °SEEK MEDICAL CARE IF: °· You have unexplained abdominal pain. °· You have abdominal pain associated with nausea or diarrhea. °· You have pain when you urinate or have a bowel movement. °· You experience abdominal pain that wakes you in the night. °· You have abdominal pain that is worsened or improved by eating food. °· You have abdominal pain that is worsened with eating fatty foods. °SEEK IMMEDIATE MEDICAL CARE IF:  °· Your pain does not go away within 2 hours. °· You have a fever. °· You keep throwing up (vomiting). °· Your pain is felt only in portions of the abdomen, such as the right side or the left lower portion of the abdomen. °· You pass bloody or black tarry stools. °MAKE SURE YOU: °· Understand these instructions.   °· Will watch your condition.   °· Will get help right away if you are not doing well or get worse.   °Document Released: 12/29/2004 Document Revised: 01/09/2013 Document Reviewed: 11/28/2012 °Exit[employer]® Patient  Information ©2014 [employer]. ° °

## 2013-07-27 NOTE — ED Notes (Addendum)
Low back pain since Tuesday- pain has now moved to abdomen around belly button- was seen at PCP this week and was started on macrobid on Friday- has urine culture with results pending

## 2013-07-28 NOTE — ED Provider Notes (Signed)
Medical screening examination/treatment/procedure(s) were performed by non-physician practitioner and as supervising physician I was immediately available for consultation/collaboration.   EKG Interpretation None        Malvin Johns, MD 07/28/13 0002

## 2014-02-03 ENCOUNTER — Encounter (HOSPITAL_BASED_OUTPATIENT_CLINIC_OR_DEPARTMENT_OTHER): Payer: Self-pay | Admitting: Emergency Medicine

## 2014-04-28 ENCOUNTER — Inpatient Hospital Stay (HOSPITAL_COMMUNITY)
Admission: AD | Admit: 2014-04-28 | Discharge: 2014-04-28 | Disposition: A | Discharge status: Home / Self Care | Payer: BLUE CROSS/BLUE SHIELD | Source: Ambulatory Visit | Attending: Obstetrics and Gynecology | Admitting: Obstetrics and Gynecology

## 2014-04-28 ENCOUNTER — Encounter (HOSPITAL_COMMUNITY): Payer: Self-pay | Admitting: *Deleted

## 2014-04-28 ENCOUNTER — Inpatient Hospital Stay (HOSPITAL_COMMUNITY): Payer: BLUE CROSS/BLUE SHIELD

## 2014-04-28 DIAGNOSIS — Z90710 Acquired absence of both cervix and uterus: Secondary | ICD-10-CM | POA: Insufficient documentation

## 2014-04-28 DIAGNOSIS — R197 Diarrhea, unspecified: Secondary | ICD-10-CM | POA: Insufficient documentation

## 2014-04-28 DIAGNOSIS — R109 Unspecified abdominal pain: Secondary | ICD-10-CM

## 2014-04-28 DIAGNOSIS — R1031 Right lower quadrant pain: Secondary | ICD-10-CM | POA: Insufficient documentation

## 2014-04-28 HISTORY — DX: Celiac disease: K90.0

## 2014-04-28 LAB — BASIC METABOLIC PANEL
Anion gap: 4 — ABNORMAL LOW (ref 5–15)
BUN: <5 mg/dL — ABNORMAL LOW (ref 6–23)
CALCIUM: 9.3 mg/dL (ref 8.4–10.5)
CO2: 31 mmol/L (ref 19–32)
Chloride: 103 mmol/L (ref 96–112)
GLUCOSE: 103 mg/dL — AB (ref 70–99)
POTASSIUM: 5 mmol/L (ref 3.5–5.1)
SODIUM: 138 mmol/L (ref 135–145)

## 2014-04-28 LAB — URINALYSIS, ROUTINE W REFLEX MICROSCOPIC
Bilirubin Urine: NEGATIVE
Glucose, UA: NEGATIVE mg/dL
Hgb urine dipstick: NEGATIVE
Ketones, ur: NEGATIVE mg/dL
Leukocytes, UA: NEGATIVE
Nitrite: NEGATIVE
Protein, ur: NEGATIVE mg/dL
Specific Gravity, Urine: 1.025 (ref 1.005–1.030)
UROBILINOGEN UA: 0.2 mg/dL (ref 0.0–1.0)
pH: 6 (ref 5.0–8.0)

## 2014-04-28 LAB — CBC WITH DIFFERENTIAL/PLATELET
BASOS PCT: 0 % (ref 0–1)
Basophils Absolute: 0 10*3/uL (ref 0.0–0.1)
Eosinophils Absolute: 0.1 10*3/uL (ref 0.0–0.7)
Eosinophils Relative: 1 % (ref 0–5)
HCT: 42.4 % (ref 36.0–46.0)
HEMOGLOBIN: 15.1 g/dL — AB (ref 12.0–15.0)
LYMPHS PCT: 29 % (ref 12–46)
Lymphs Abs: 2.3 10*3/uL (ref 0.7–4.0)
MCH: 32.3 pg (ref 26.0–34.0)
MCHC: 35.6 g/dL (ref 30.0–36.0)
MCV: 90.6 fL (ref 78.0–100.0)
Monocytes Absolute: 0.6 10*3/uL (ref 0.1–1.0)
Monocytes Relative: 7 % (ref 3–12)
NEUTROS ABS: 4.9 10*3/uL (ref 1.7–7.7)
Neutrophils Relative %: 63 % (ref 43–77)
PLATELETS: 221 10*3/uL (ref 150–400)
RBC: 4.68 MIL/uL (ref 3.87–5.11)
RDW: 13.5 % (ref 11.5–15.5)
WBC: 7.9 10*3/uL (ref 4.0–10.5)

## 2014-04-28 MED ORDER — OXYCODONE-ACETAMINOPHEN 5-325 MG PO TABS
1.0000 | ORAL_TABLET | Freq: Once | ORAL | Status: AC
  Administered 2014-04-28: 1 via ORAL
  Filled 2014-04-28: qty 1

## 2014-04-28 MED ORDER — ONDANSETRON 4 MG PO TBDP
4.0000 mg | ORAL_TABLET | Freq: Once | ORAL | Status: AC
  Administered 2014-04-28: 4 mg via ORAL
  Filled 2014-04-28: qty 1

## 2014-04-28 NOTE — Discharge Instructions (Signed)
Abdominal Pain, Women °Abdominal (stomach, pelvic, or belly) pain can be caused by many things. It is important to tell your doctor: °· The location of the pain. °· Does it come and go or is it present all the time? °· Are there things that start the pain (eating certain foods, exercise)? °· Are there other symptoms associated with the pain (fever, nausea, vomiting, diarrhea)? °All of this is helpful to know when trying to find the cause of the pain. °CAUSES  °· Stomach: virus or bacteria infection, or ulcer. °· Intestine: appendicitis (inflamed appendix), regional ileitis (Crohn's disease), ulcerative colitis (inflamed colon), irritable bowel syndrome, diverticulitis (inflamed diverticulum of the colon), or cancer of the stomach or intestine. °· Gallbladder disease or stones in the gallbladder. °· Kidney disease, kidney stones, or infection. °· Pancreas infection or cancer. °· Fibromyalgia (pain disorder). °· Diseases of the female organs: °¨ Uterus: fibroid (non-cancerous) tumors or infection. °¨ Fallopian tubes: infection or tubal pregnancy. °¨ Ovary: cysts or tumors. °¨ Pelvic adhesions (scar tissue). °¨ Endometriosis (uterus lining tissue growing in the pelvis and on the pelvic organs). °¨ Pelvic congestion syndrome (female organs filling up with blood just before the menstrual period). °¨ Pain with the menstrual period. °¨ Pain with ovulation (producing an egg). °¨ Pain with an IUD (intrauterine device, birth control) in the uterus. °¨ Cancer of the female organs. °· Functional pain (pain not caused by a disease, may improve without treatment). °· Psychological pain. °· Depression. °DIAGNOSIS  °Your doctor will decide the seriousness of your pain by doing an examination. °· Blood tests. °· X-rays. °· Ultrasound. °· CT scan (computed tomography, special type of X-ray). °· MRI (magnetic resonance imaging). °· Cultures, for infection. °· Barium enema (dye inserted in the large intestine, to better view it with  X-rays). °· Colonoscopy (looking in intestine with a lighted tube). °· Laparoscopy (minor surgery, looking in abdomen with a lighted tube). °· Major abdominal exploratory surgery (looking in abdomen with a large incision). °TREATMENT  °The treatment will depend on the cause of the pain.  °· Many cases can be observed and treated at home. °· Over-the-counter medicines recommended by your caregiver. °· Prescription medicine. °· Antibiotics, for infection. °· Birth control pills, for painful periods or for ovulation pain. °· Hormone treatment, for endometriosis. °· Nerve blocking injections. °· Physical therapy. °· Antidepressants. °· Counseling with a psychologist or psychiatrist. °· Minor or major surgery. °HOME CARE INSTRUCTIONS  °· Do not take laxatives, unless directed by your caregiver. °· Take over-the-counter pain medicine only if ordered by your caregiver. Do not take aspirin because it can cause an upset stomach or bleeding. °· Try a clear liquid diet (broth or water) as ordered by your caregiver. Slowly move to a bland diet, as tolerated, if the pain is related to the stomach or intestine. °· Have a thermometer and take your temperature several times a day, and record it. °· Bed rest and sleep, if it helps the pain. °· Avoid sexual intercourse, if it causes pain. °· Avoid stressful situations. °· Keep your follow-up appointments and tests, as your caregiver orders. °· If the pain does not go away with medicine or surgery, you may try: °¨ Acupuncture. °¨ Relaxation exercises (yoga, meditation). °¨ Group therapy. °¨ Counseling. °SEEK MEDICAL CARE IF:  °· You notice certain foods cause stomach pain. °· Your home care treatment is not helping your pain. °· You need stronger pain medicine. °· You want your IUD removed. °· You feel faint or   lightheaded. °· You develop nausea and vomiting. °· You develop a rash. °· You are having side effects or an allergy to your medicine. °SEEK IMMEDIATE MEDICAL CARE IF:  °· Your  pain does not go away or gets worse. °· You have a fever. °· Your pain is felt only in portions of the abdomen. The right side could possibly be appendicitis. The left lower portion of the abdomen could be colitis or diverticulitis. °· You are passing blood in your stools (bright red or black tarry stools, with or without vomiting). °· You have blood in your urine. °· You develop chills, with or without a fever. °· You pass out. °MAKE SURE YOU:  °· Understand these instructions. °· Will watch your condition. °· Will get help right away if you are not doing well or get worse. °Document Released: 01/16/2007 Document Revised: 08/05/2013 Document Reviewed: 02/05/2009 °ExitCare® Patient Information ©2015 [employer]. This information is not intended to replace advice given to you by your health care provider. Make sure you discuss any questions you have with your health care provider. ° °

## 2014-04-28 NOTE — MAU Note (Signed)
Pt reports nausea and diarrhea for the last few days, began having lower rt side pain for the last 2 days that worsened today.

## 2014-04-28 NOTE — MAU Provider Note (Signed)
CSN: [id number]     Arrival date & time 04/28/14  1924 History   None    Chief Complaint  Patient presents with  . Nausea  . Diarrhea  . Abdominal Pain     (Consider location/radiation/quality/duration/timing/severity/associated sxs/prior Treatment) Patient is a 39 y.o. female presenting with diarrhea and abdominal pain. The history is provided by the patient.  Diarrhea The primary symptoms include fever, abdominal pain, nausea and diarrhea. Primary symptoms do not include vomiting, dysuria or rash. The onset was sudden. The problem has not changed since onset. The illness is also significant for chills and back pain.  Abdominal Pain The primary symptoms of the illness include abdominal pain, fever, nausea and diarrhea. The primary symptoms of the illness do not include shortness of breath, vomiting, dysuria, vaginal discharge or vaginal bleeding.  Additional symptoms associated with the illness include chills and back pain. Symptoms associated with the illness do not include urgency or frequency.  Sheri Evans is a 39 y.o. female who presents to the ED with right side abdominal pain. The pain is similar to when she has had ruptured cysts. She reports having had nausea and diarrhea for the past 2 days but this pain started suddenly today. She has taken nothing for pain.   Past Medical History  Diagnosis Date  . PONV (postoperative nausea and vomiting)   . Ovarian cyst   . Endometriosis   . Celiac disease 2015  . Headache    Past Surgical History  Procedure Laterality Date  . Cesarean section    . Abdominal hysterectomy    . Tonsillectomy    . Laporoscopy    . Gallbladder surgery    . Laparoscopy Right 01/23/2013    Procedure: LAPAROSCOPY OPERATIVE, Right Ovarian Cystectomy, Lysis of Adhesions;  Surgeon: Allena Katz, MD;  Location: [facility];  Service: Gynecology;  Laterality: Right;   Family History  Problem Relation Age of Onset  . Anesthesia problems Neg Hx   .  Hypotension Neg Hx   . Malignant hyperthermia Neg Hx   . Pseudochol deficiency Neg Hx    History  Substance Use Topics  . Smoking status: Never Smoker   . Smokeless tobacco: Never Used  . Alcohol Use: Yes     Comment: occ   OB History    Gravida Para Term Preterm AB TAB SAB Ectopic Multiple Living   3 3 3  0 0 0 0 0 0 3     Review of Systems  Constitutional: Positive for fever and chills.  HENT: Negative.   Eyes: Negative for photophobia, pain, redness and visual disturbance.  Respiratory: Negative for cough, shortness of breath and wheezing.   Cardiovascular: Negative for chest pain, palpitations and leg swelling.  Gastrointestinal: Positive for nausea, abdominal pain and diarrhea. Negative for vomiting.  Genitourinary: Negative for dysuria, urgency, frequency, vaginal bleeding and vaginal discharge.  Musculoskeletal: Positive for back pain. Negative for neck pain.  Skin: Negative for rash.  Neurological: Negative for dizziness, seizures, syncope, light-headedness and headaches.      Allergies  Penicillins and Dilaudid  Home Medications   Prior to Admission medications   Medication Sig Start Date End Date Taking? Authorizing Provider  ibuprofen (ADVIL,MOTRIN) 200 MG tablet Take 800 mg by mouth every 6 (six) hours as needed.   Yes Historical Provider, MD  Multiple Vitamin (MULTIVITAMIN WITH MINERALS) TABS tablet Take 1 tablet by mouth daily.   Yes Historical Provider, MD  ondansetron (ZOFRAN) 4 MG tablet Take 1 tablet (  4 mg total) by mouth every 6 (six) hours. Patient not taking: Reported on 04/28/2014 07/27/13   Nehemiah Settle A Upstill, PA-C  oxyCODONE-acetaminophen (PERCOCET/ROXICET) 5-325 MG per tablet Take 1-2 tablets by mouth every 4 (four) hours as needed for severe pain. Patient not taking: Reported on 04/28/2014 07/27/13   Nehemiah Settle A Upstill, PA-C   BP 148/82 mmHg  Pulse 66  Temp(Src) 98.4 F (36.9 C) (Oral)  Resp 18  Ht 5' 9"  (1.753 m)  Wt 252 lb (114.306 kg)  BMI 37.20  kg/m2  SpO2 99%  LMP 05/01/2011 Physical Exam  Constitutional: She is oriented to person, place, and time. She appears well-developed and well-nourished.  Eyes: EOM are normal.  Neck: Neck supple.  Pulmonary/Chest: Effort normal.  Abdominal: Soft. There is tenderness in the right lower quadrant. There is no rebound, no guarding and no CVA tenderness.  Musculoskeletal: Normal range of motion.  Neurological: She is alert and oriented to person, place, and time. No cranial nerve deficit.  Skin: Skin is warm and dry.  Psychiatric: She has a normal mood and affect. Her behavior is normal.  Nursing note and vitals reviewed.   ED Course  Procedures (including critical care time) Labs Review Results for orders placed or performed during the hospital encounter of 04/28/14 (from the past 24 hour(s))  Urinalysis, Routine w reflex microscopic     Status: None   Collection Time: 04/28/14  7:45 PM  Result Value Ref Range   Color, Urine YELLOW YELLOW   APPearance CLEAR CLEAR   Specific Gravity, Urine 1.025 1.005 - 1.030   pH 6.0 5.0 - 8.0   Glucose, UA NEGATIVE NEGATIVE mg/dL   Hgb urine dipstick NEGATIVE NEGATIVE   Bilirubin Urine NEGATIVE NEGATIVE   Ketones, ur NEGATIVE NEGATIVE mg/dL   Protein, ur NEGATIVE NEGATIVE mg/dL   Urobilinogen, UA 0.2 0.0 - 1.0 mg/dL   Nitrite NEGATIVE NEGATIVE   Leukocytes, UA NEGATIVE NEGATIVE  CBC with Differential/Platelet     Status: Abnormal   Collection Time: 04/28/14  8:35 PM  Result Value Ref Range   WBC 7.9 4.0 - 10.5 K/uL   RBC 4.68 3.87 - 5.11 MIL/uL   Hemoglobin 15.1 (H) 12.0 - 15.0 g/dL   HCT 42.4 36.0 - 46.0 %   MCV 90.6 78.0 - 100.0 fL   MCH 32.3 26.0 - 34.0 pg   MCHC 35.6 30.0 - 36.0 g/dL   RDW 13.5 11.5 - 15.5 %   Platelets 221 150 - 400 K/uL   Neutrophils Relative % 63 43 - 77 %   Neutro Abs 4.9 1.7 - 7.7 K/uL   Lymphocytes Relative 29 12 - 46 %   Lymphs Abs 2.3 0.7 - 4.0 K/uL   Monocytes Relative 7 3 - 12 %   Monocytes Absolute 0.6  0.1 - 1.0 K/uL   Eosinophils Relative 1 0 - 5 %   Eosinophils Absolute 0.1 0.0 - 0.7 K/uL   Basophils Relative 0 0 - 1 %   Basophils Absolute 0.0 0.0 - 0.1 K/uL  Basic metabolic panel     Status: Abnormal   Collection Time: 04/28/14  8:35 PM  Result Value Ref Range   Sodium 138 135 - 145 mmol/L   Potassium 5.0 3.5 - 5.1 mmol/L   Chloride 103 96 - 112 mmol/L   CO2 31 19 - 32 mmol/L   Glucose, Bld 103 (H) 70 - 99 mg/dL   BUN <5 (L) 6 - 23 mg/dL   Creatinine, Ser <0.30 (L) 0.50 -  1.10 mg/dL   Calcium 9.3 8.4 - 10.5 mg/dL   GFR calc non Af Amer NOT CALCULATED >90 mL/min   GFR calc Af Amer NOT CALCULATED >90 mL/min   Anion gap 4 (L) 5 - 15     MDM   Care turned over to Kerry Hough, [facility] @ 21:13 Patient awaiting ultrasound.  Final diagnoses:  Abdominal pain    2113 - Care assumed from [facility], NP  US Transvaginal Non-ob  04/28/2014   CLINICAL DATA:  Right lower quadrant pain for 2 days.  EXAM: TRANSABDOMINAL AND TRANSVAGINAL ULTRASOUND OF PELVIS  TECHNIQUE: Both transabdominal and transvaginal ultrasound examinations of the pelvis were performed. Transabdominal technique was performed for global imaging of the pelvis including uterus, ovaries, adnexal regions, and pelvic cul-de-sac. It was necessary to proceed with endovaginal exam following the transabdominal exam to visualize the uterus, ovaries, and adnexa.  COMPARISON:  12/27/2013  FINDINGS: Uterus:  Surgically absent.  Right Ovary: Suboptimally visualized secondary to overlying bowel gas. 2.9 x 2.1 x 2.3 cm. Grossly normal in morphology. The previously described solid mass is not identified.  Left Ovary:  Surgically absent  Other Findings:  No significant free fluid.  IMPRESSION: 1. Grossly normal appearance of the right ovary. Suboptimally evaluated secondary to overlying bowel gas. 2. Status post hysterectomy and left oophorectomy.   Electronically Signed   By: Abigail Miyamoto M.D.   On: 04/28/2014 22:02   US Pelvis  Complete  04/28/2014   CLINICAL DATA:  Right lower quadrant pain for 2 days.  EXAM: TRANSABDOMINAL AND TRANSVAGINAL ULTRASOUND OF PELVIS  TECHNIQUE: Both transabdominal and transvaginal ultrasound examinations of the pelvis were performed. Transabdominal technique was performed for global imaging of the pelvis including uterus, ovaries, adnexal regions, and pelvic cul-de-sac. It was necessary to proceed with endovaginal exam following the transabdominal exam to visualize the uterus, ovaries, and adnexa.  COMPARISON:  12/27/2013  FINDINGS: Uterus:  Surgically absent.  Right Ovary: Suboptimally visualized secondary to overlying bowel gas. 2.9 x 2.1 x 2.3 cm. Grossly normal in morphology. The previously described solid mass is not identified.  Left Ovary:  Surgically absent  Other Findings:  No significant free fluid.  IMPRESSION: 1. Grossly normal appearance of the right ovary. Suboptimally evaluated secondary to overlying bowel gas. 2. Status post hysterectomy and left oophorectomy.   Electronically Signed   By: Abigail Miyamoto M.D.   On: 04/28/2014 22:02   MDM Discussed patient results and [place] with Dr. Julien Girt. [place] for discharge at this time. Patient can take Motrin for pain PRN and follow-up in the office.  Low suspicion for appendicitis given patient is afebrile, WBCs are normal and she denies N/V today  A: RLQ abdominal pain, unknown source  P: Discharge home Patient advised to take Motrin PRN for pain Warning signs for appendicitis discussed. Patient advised to go to [facility] or [facility] if symptoms arise Patient encouraged to follow-up in the office if symptoms persist Patient may return to MAU as needed or if her condition were to change or worsen   Luvenia Redden, PA-C  04/28/2014 10:41 PM

## 2014-07-28 ENCOUNTER — Other Ambulatory Visit: Payer: Self-pay | Admitting: Obstetrics and Gynecology

## 2014-07-30 LAB — CYTOLOGY - PAP

## 2014-08-20 ENCOUNTER — Other Ambulatory Visit: Payer: Self-pay | Admitting: Orthopedic Surgery

## 2014-08-20 DIAGNOSIS — M25511 Pain in right shoulder: Secondary | ICD-10-CM

## 2014-09-03 DIAGNOSIS — M75100 Unspecified rotator cuff tear or rupture of unspecified shoulder, not specified as traumatic: Secondary | ICD-10-CM

## 2014-09-03 DIAGNOSIS — M19011 Primary osteoarthritis, right shoulder: Secondary | ICD-10-CM

## 2014-09-03 DIAGNOSIS — M7550 Bursitis of unspecified shoulder: Secondary | ICD-10-CM

## 2014-09-03 HISTORY — DX: Bursitis of unspecified shoulder: M75.50

## 2014-09-03 HISTORY — DX: Primary osteoarthritis, right shoulder: M19.011

## 2014-09-03 HISTORY — DX: Unspecified rotator cuff tear or rupture of unspecified shoulder, not specified as traumatic: M75.100

## 2014-09-15 ENCOUNTER — Ambulatory Visit
Admission: RE | Admit: 2014-09-15 | Discharge: 2014-09-15 | Disposition: A | Discharge status: Home / Self Care | Payer: BLUE CROSS/BLUE SHIELD | Source: Ambulatory Visit | Attending: Orthopedic Surgery | Admitting: Orthopedic Surgery

## 2014-09-15 DIAGNOSIS — M25511 Pain in right shoulder: Secondary | ICD-10-CM

## 2014-09-15 MED ORDER — IOHEXOL 180 MG/ML  SOLN
15.0000 mL | Freq: Once | INTRAMUSCULAR | Status: AC | PRN
  Administered 2014-09-15: 15 mL via INTRA_ARTICULAR

## 2014-09-30 ENCOUNTER — Encounter (HOSPITAL_BASED_OUTPATIENT_CLINIC_OR_DEPARTMENT_OTHER): Payer: Self-pay | Admitting: *Deleted

## 2014-09-30 DIAGNOSIS — L237 Allergic contact dermatitis due to plants, except food: Secondary | ICD-10-CM

## 2014-09-30 HISTORY — DX: Allergic contact dermatitis due to plants, except food: L23.7

## 2014-10-03 ENCOUNTER — Encounter (HOSPITAL_BASED_OUTPATIENT_CLINIC_OR_DEPARTMENT_OTHER): Payer: Self-pay | Admitting: Anesthesiology

## 2014-10-03 ENCOUNTER — Ambulatory Visit (HOSPITAL_BASED_OUTPATIENT_CLINIC_OR_DEPARTMENT_OTHER)
Admission: RE | Admit: 2014-10-03 | Discharge: 2014-10-03 | Disposition: A | Discharge status: Home / Self Care | Payer: BLUE CROSS/BLUE SHIELD | Source: Ambulatory Visit | Attending: Orthopedic Surgery | Admitting: Orthopedic Surgery

## 2014-10-03 ENCOUNTER — Ambulatory Visit (HOSPITAL_BASED_OUTPATIENT_CLINIC_OR_DEPARTMENT_OTHER): Payer: BLUE CROSS/BLUE SHIELD | Admitting: Anesthesiology

## 2014-10-03 ENCOUNTER — Encounter (HOSPITAL_BASED_OUTPATIENT_CLINIC_OR_DEPARTMENT_OTHER)
Admission: RE | Disposition: A | Discharge status: Home / Self Care | Payer: Self-pay | Source: Ambulatory Visit | Attending: Orthopedic Surgery

## 2014-10-03 DIAGNOSIS — Z6837 Body mass index (BMI) 37.0-37.9, adult: Secondary | ICD-10-CM | POA: Insufficient documentation

## 2014-10-03 DIAGNOSIS — M19011 Primary osteoarthritis, right shoulder: Secondary | ICD-10-CM | POA: Insufficient documentation

## 2014-10-03 DIAGNOSIS — M25511 Pain in right shoulder: Secondary | ICD-10-CM | POA: Diagnosis present

## 2014-10-03 DIAGNOSIS — M7541 Impingement syndrome of right shoulder: Secondary | ICD-10-CM | POA: Diagnosis not present

## 2014-10-03 DIAGNOSIS — M75101 Unspecified rotator cuff tear or rupture of right shoulder, not specified as traumatic: Secondary | ICD-10-CM

## 2014-10-03 DIAGNOSIS — M7551 Bursitis of right shoulder: Secondary | ICD-10-CM | POA: Insufficient documentation

## 2014-10-03 DIAGNOSIS — Z791 Long term (current) use of non-steroidal anti-inflammatories (NSAID): Secondary | ICD-10-CM | POA: Diagnosis not present

## 2014-10-03 DIAGNOSIS — G43909 Migraine, unspecified, not intractable, without status migrainosus: Secondary | ICD-10-CM | POA: Insufficient documentation

## 2014-10-03 DIAGNOSIS — Z79899 Other long term (current) drug therapy: Secondary | ICD-10-CM | POA: Diagnosis not present

## 2014-10-03 DIAGNOSIS — M7581 Other shoulder lesions, right shoulder: Secondary | ICD-10-CM | POA: Insufficient documentation

## 2014-10-03 DIAGNOSIS — M75121 Complete rotator cuff tear or rupture of right shoulder, not specified as traumatic: Secondary | ICD-10-CM | POA: Insufficient documentation

## 2014-10-03 DIAGNOSIS — Z79891 Long term (current) use of opiate analgesic: Secondary | ICD-10-CM | POA: Diagnosis not present

## 2014-10-03 HISTORY — DX: Unspecified rotator cuff tear or rupture of unspecified shoulder, not specified as traumatic: M75.100

## 2014-10-03 HISTORY — DX: Allergic contact dermatitis due to plants, except food: L23.7

## 2014-10-03 HISTORY — PX: SHOULDER ARTHROSCOPY WITH ROTATOR CUFF REPAIR AND SUBACROMIAL DECOMPRESSION: SHX5686

## 2014-10-03 HISTORY — DX: Migraine, unspecified, not intractable, without status migrainosus: G43.909

## 2014-10-03 HISTORY — DX: Bursitis of unspecified shoulder: M75.50

## 2014-10-03 HISTORY — PX: SHOULDER ARTHROSCOPY WITH DISTAL CLAVICLE RESECTION: SHX5675

## 2014-10-03 HISTORY — DX: Primary osteoarthritis, right shoulder: M19.011

## 2014-10-03 LAB — POCT HEMOGLOBIN-HEMACUE: HEMOGLOBIN: 15.7 g/dL — AB (ref 12.0–15.0)

## 2014-10-03 SURGERY — SHOULDER ARTHROSCOPY WITH ROTATOR CUFF REPAIR AND SUBACROMIAL DECOMPRESSION
Anesthesia: Regional | Site: Shoulder | Laterality: Right

## 2014-10-03 MED ORDER — BUPIVACAINE HCL (PF) 0.25 % IJ SOLN
INTRAMUSCULAR | Status: AC
  Filled 2014-10-03: qty 30

## 2014-10-03 MED ORDER — OXYCODONE-ACETAMINOPHEN 10-325 MG PO TABS: 1.0000 | ORAL_TABLET | Freq: Four times a day (QID) | ORAL | Status: DC | PRN

## 2014-10-03 MED ORDER — LACTATED RINGERS IV SOLN
INTRAVENOUS | Status: DC
  Administered 2014-10-03 (×2): via INTRAVENOUS

## 2014-10-03 MED ORDER — MIDAZOLAM HCL 2 MG/2ML IJ SOLN
INTRAMUSCULAR | Status: AC
  Filled 2014-10-03: qty 2

## 2014-10-03 MED ORDER — LIDOCAINE HCL (CARDIAC) 20 MG/ML IV SOLN
INTRAVENOUS | Status: DC | PRN
  Administered 2014-10-03: 30 mg via INTRAVENOUS

## 2014-10-03 MED ORDER — PROMETHAZINE HCL 25 MG/ML IJ SOLN
INTRAMUSCULAR | Status: AC
  Filled 2014-10-03: qty 1

## 2014-10-03 MED ORDER — BUPIVACAINE HCL (PF) 0.5 % IJ SOLN
INTRAMUSCULAR | Status: AC
  Filled 2014-10-03: qty 30

## 2014-10-03 MED ORDER — ONDANSETRON HCL 4 MG/2ML IJ SOLN
INTRAMUSCULAR | Status: DC | PRN
  Administered 2014-10-03: 4 mg via INTRAVENOUS

## 2014-10-03 MED ORDER — DEXAMETHASONE SODIUM PHOSPHATE 4 MG/ML IJ SOLN
INTRAMUSCULAR | Status: DC | PRN
  Administered 2014-10-03: 10 mg via INTRAVENOUS

## 2014-10-03 MED ORDER — SODIUM CHLORIDE 0.9 % IR SOLN
Status: DC | PRN
  Administered 2014-10-03: 21000 mL

## 2014-10-03 MED ORDER — FENTANYL CITRATE (PF) 100 MCG/2ML IJ SOLN
INTRAMUSCULAR | Status: AC
  Filled 2014-10-03: qty 2

## 2014-10-03 MED ORDER — SCOPOLAMINE 1 MG/3DAYS TD PT72
MEDICATED_PATCH | TRANSDERMAL | Status: AC
  Filled 2014-10-03: qty 1

## 2014-10-03 MED ORDER — FENTANYL CITRATE (PF) 100 MCG/2ML IJ SOLN
50.0000 ug | INTRAMUSCULAR | Status: AC | PRN
Start: 2014-10-03 — End: 2014-10-03
  Administered 2014-10-03 (×3): 25 ug via INTRAVENOUS
  Administered 2014-10-03: 50 ug via INTRAVENOUS

## 2014-10-03 MED ORDER — CLINDAMYCIN PHOSPHATE 900 MG/50ML IV SOLN
INTRAVENOUS | Status: AC
  Filled 2014-10-03: qty 50

## 2014-10-03 MED ORDER — SENNA 8.6 MG PO TABS: 1.0000 | ORAL_TABLET | Freq: Every evening | ORAL | Status: DC | PRN

## 2014-10-03 MED ORDER — OXYCODONE HCL 5 MG PO TABS: 5.0000 mg | ORAL_TABLET | Freq: Once | ORAL | Status: DC | PRN

## 2014-10-03 MED ORDER — MEPERIDINE HCL 25 MG/ML IJ SOLN: 6.2500 mg | INTRAMUSCULAR | Status: DC | PRN

## 2014-10-03 MED ORDER — BUPIVACAINE-EPINEPHRINE (PF) 0.5% -1:200000 IJ SOLN
INTRAMUSCULAR | Status: DC | PRN
  Administered 2014-10-03: 30 mL via PERINEURAL

## 2014-10-03 MED ORDER — MIDAZOLAM HCL 2 MG/2ML IJ SOLN
1.0000 mg | INTRAMUSCULAR | Status: DC | PRN
Start: 2014-10-03 — End: 2014-10-03
  Administered 2014-10-03 (×2): 2 mg via INTRAVENOUS

## 2014-10-03 MED ORDER — ONDANSETRON HCL 4 MG PO TABS: 4.0000 mg | ORAL_TABLET | Freq: Three times a day (TID) | ORAL | Status: DC | PRN

## 2014-10-03 MED ORDER — CLINDAMYCIN PHOSPHATE 900 MG/50ML IV SOLN
900.0000 mg | INTRAVENOUS | Status: AC
Start: 2014-10-03 — End: 2014-10-03
  Administered 2014-10-03: 900 mg via INTRAVENOUS

## 2014-10-03 MED ORDER — PROPOFOL 10 MG/ML IV BOLUS
INTRAVENOUS | Status: DC | PRN
  Administered 2014-10-03: 200 mg via INTRAVENOUS

## 2014-10-03 MED ORDER — SUCCINYLCHOLINE CHLORIDE 20 MG/ML IJ SOLN
INTRAMUSCULAR | Status: DC | PRN
  Administered 2014-10-03: 100 mg via INTRAVENOUS

## 2014-10-03 MED ORDER — OXYCODONE HCL 5 MG/5ML PO SOLN: 5.0000 mg | Freq: Once | ORAL | Status: DC | PRN

## 2014-10-03 MED ORDER — SCOPOLAMINE 1 MG/3DAYS TD PT72
1.0000 | MEDICATED_PATCH | Freq: Once | TRANSDERMAL | Status: DC | PRN
  Administered 2014-10-03: 1.5 mg via TRANSDERMAL

## 2014-10-03 MED ORDER — FENTANYL CITRATE (PF) 100 MCG/2ML IJ SOLN
INTRAMUSCULAR | Status: AC
  Filled 2014-10-03: qty 6

## 2014-10-03 MED ORDER — GLYCOPYRROLATE 0.2 MG/ML IJ SOLN: 0.2000 mg | Freq: Once | INTRAMUSCULAR | Status: DC | PRN

## 2014-10-03 MED ORDER — PROMETHAZINE HCL 25 MG/ML IJ SOLN
6.2500 mg | INTRAMUSCULAR | Status: DC | PRN
  Administered 2014-10-03: 6.25 mg via INTRAVENOUS

## 2014-10-03 MED ORDER — METHOCARBAMOL 500 MG PO TABS: 500.0000 mg | ORAL_TABLET | Freq: Four times a day (QID) | ORAL | Status: DC

## 2014-10-03 MED ORDER — FENTANYL CITRATE (PF) 100 MCG/2ML IJ SOLN: 25.0000 ug | INTRAMUSCULAR | Status: DC | PRN

## 2014-10-03 SURGICAL SUPPLY — 78 items
ANCHOR SUT BIO SW 4.75X19.1 (Anchor) ×8 IMPLANT
BLADE CUTTER GATOR 3.5 (BLADE) ×4 IMPLANT
BLADE GREAT WHITE 4.2 (BLADE) IMPLANT
BLADE GREAT WHITE 4.2MM (BLADE)
BLADE SURG 15 STRL LF DISP TIS (BLADE) IMPLANT
BLADE SURG 15 STRL SS (BLADE)
BUR OVAL 6.0 (BURR) ×4 IMPLANT
CANNULA 5.75X71 LONG (CANNULA) ×4 IMPLANT
CANNULA TWIST IN 8.25X7CM (CANNULA) ×8 IMPLANT
CLOSURE STERI-STRIP 1/2X4 (GAUZE/BANDAGES/DRESSINGS) ×1
CLSR STERI-STRIP ANTIMIC 1/2X4 (GAUZE/BANDAGES/DRESSINGS) ×3 IMPLANT
CUTTER MENISCUS  4.2MM (BLADE)
CUTTER MENISCUS 4.2MM (BLADE) IMPLANT
DECANTER SPIKE VIAL GLASS SM (MISCELLANEOUS) IMPLANT
DRAPE INCISE IOBAN 66X45 STRL (DRAPES) ×4 IMPLANT
DRAPE SHOULDER BEACH CHAIR (DRAPES) ×4 IMPLANT
DRAPE U 20/CS (DRAPES) ×4 IMPLANT
DRAPE U-SHAPE 47X51 STRL (DRAPES) ×4 IMPLANT
DRSG PAD ABDOMINAL 8X10 ST (GAUZE/BANDAGES/DRESSINGS) ×4 IMPLANT
DURAPREP 26ML APPLICATOR (WOUND CARE) ×4 IMPLANT
ELECT REM PT RETURN 9FT ADLT (ELECTROSURGICAL)
ELECTRODE REM PT RTRN 9FT ADLT (ELECTROSURGICAL) IMPLANT
FIBERSTICK 2 (SUTURE) IMPLANT
GAUZE SPONGE 4X4 12PLY STRL (GAUZE/BANDAGES/DRESSINGS) ×4 IMPLANT
GLOVE BIO SURGEON STRL SZ 6.5 (GLOVE) ×3 IMPLANT
GLOVE BIO SURGEON STRL SZ8 (GLOVE) ×4 IMPLANT
GLOVE BIO SURGEONS STRL SZ 6.5 (GLOVE) ×1
GLOVE BIOGEL PI IND STRL 7.0 (GLOVE) ×8 IMPLANT
GLOVE BIOGEL PI IND STRL 8 (GLOVE) ×4 IMPLANT
GLOVE BIOGEL PI INDICATOR 7.0 (GLOVE) ×8
GLOVE BIOGEL PI INDICATOR 8 (GLOVE) ×4
GLOVE ECLIPSE 6.5 STRL STRAW (GLOVE) ×8 IMPLANT
GLOVE ORTHO TXT STRL SZ7.5 (GLOVE) ×4 IMPLANT
GOWN STRL REUS W/ TWL LRG LVL3 (GOWN DISPOSABLE) ×2 IMPLANT
GOWN STRL REUS W/ TWL XL LVL3 (GOWN DISPOSABLE) ×6 IMPLANT
GOWN STRL REUS W/TWL LRG LVL3 (GOWN DISPOSABLE) ×2
GOWN STRL REUS W/TWL XL LVL3 (GOWN DISPOSABLE) ×6
IMMOBILIZER SHOULDER FOAM XLGE (SOFTGOODS) IMPLANT
IV NS IRRIG 3000ML ARTHROMATIC (IV SOLUTION) ×28 IMPLANT
KIT BIO-TENODESIS 3X8 DISP (MISCELLANEOUS)
KIT INSRT BABSR STRL DISP BTN (MISCELLANEOUS) IMPLANT
KIT SHOULDER TRACTION (DRAPES) ×4 IMPLANT
LASSO 90 CVE QUICKPAS (DISPOSABLE) ×4 IMPLANT
MANIFOLD NEPTUNE II (INSTRUMENTS) ×4 IMPLANT
NDL SUT 6 .5 CRC .975X.05 MAYO (NEEDLE) IMPLANT
NEEDLE MAYO TAPER (NEEDLE)
NEEDLE SCORPION MULTI FIRE (NEEDLE) ×8 IMPLANT
PACK ARTHROSCOPY DSU (CUSTOM PROCEDURE TRAY) ×4 IMPLANT
PACK BASIN DAY SURGERY FS (CUSTOM PROCEDURE TRAY) ×4 IMPLANT
PENCIL BUTTON HOLSTER BLD 10FT (ELECTRODE) ×4 IMPLANT
SET ARTHROSCOPY TUBING (MISCELLANEOUS) ×2
SET ARTHROSCOPY TUBING LN (MISCELLANEOUS) ×2 IMPLANT
SHEET MEDIUM DRAPE 40X70 STRL (DRAPES) ×4 IMPLANT
SLEEVE SCD COMPRESS KNEE MED (MISCELLANEOUS) ×4 IMPLANT
SLING ARM IMMOBILIZER LRG (SOFTGOODS) IMPLANT
SLING ARM IMMOBILIZER MED (SOFTGOODS) IMPLANT
SLING ARM IMMOBILIZER XL (CAST SUPPLIES) ×4 IMPLANT
SLING ARM LRG ADULT FOAM STRAP (SOFTGOODS) IMPLANT
SLING ARM MED ADULT FOAM STRAP (SOFTGOODS) IMPLANT
SLING ARM XL FOAM STRAP (SOFTGOODS) IMPLANT
SPONGE LAP 4X18 X RAY DECT (DISPOSABLE) ×4 IMPLANT
SUT FIBERWIRE #2 38 T-5 BLUE (SUTURE)
SUT MNCRL AB 4-0 PS2 18 (SUTURE) ×4 IMPLANT
SUT PDS AB 1 CT  36 (SUTURE)
SUT PDS AB 1 CT 36 (SUTURE) IMPLANT
SUT PROLENE 0 CT 1 CR/8 (SUTURE) IMPLANT
SUT TIGER TAPE 7 IN WHITE (SUTURE) ×4 IMPLANT
SUT VIC AB 3-0 SH 27 (SUTURE)
SUT VIC AB 3-0 SH 27X BRD (SUTURE) IMPLANT
SUT VICRYL 3-0 CR8 SH (SUTURE) ×4 IMPLANT
SUTURE FIBERWR #2 38 T-5 BLUE (SUTURE) IMPLANT
TAPE FIBER 2MM 7IN #2 BLUE (SUTURE) IMPLANT
TOWEL OR 17X24 6PK STRL BLUE (TOWEL DISPOSABLE) ×4 IMPLANT
TOWEL OR NON WOVEN STRL DISP B (DISPOSABLE) ×4 IMPLANT
TUBE CONNECTING 20'X1/4 (TUBING)
TUBE CONNECTING 20X1/4 (TUBING) IMPLANT
WAND STAR VAC 90 (SURGICAL WAND) ×4 IMPLANT
WATER STERILE IRR 1000ML POUR (IV SOLUTION) ×4 IMPLANT

## 2014-10-03 NOTE — Progress Notes (Signed)
Assisted Dr. Germeroth with right, ultrasound guided, interscalene  block. Side rails up, monitors on throughout procedure. See vital signs in flow sheet. Tolerated Procedure well.  

## 2014-10-03 NOTE — Discharge Instructions (Signed)
Diet: As you were doing prior to hospitalization   Shower:  May shower but keep the wounds dry, use an occlusive plastic wrap, NO SOAKING IN TUB.  If the bandage gets wet, change with a clean dry gauze.  Dressing:  Keep the wounds dry for 2 weeks.  You may change the dressings if needed in 2-3 days, if you have a splint, just leave it in place.  Activity:  Increase activity slowly as tolerated, but follow the weight bearing instructions below.    Weight Bearing:   Wear sling all of the time.    To prevent constipation: you may use a stool softener such as -  Colace (over the counter) 100 mg by mouth twice a day  Drink plenty of fluids (prune juice may be helpful) and high fiber foods Miralax (over the counter) for constipation as needed.    Itching:  If you experience itching with your medications, try taking only a single pain pill, or even half a pain pill at a time.  You may take up to 10 pain pills per day, and you can also use benadryl over the counter for itching or also to help with sleep.   Precautions:  If you experience chest pain or shortness of breath - call 911 immediately for transfer to the hospital emergency department!!  If you develop a fever greater that 101 F, purulent drainage from wound, increased redness or drainage from wound, or calf pain -- Call the office at [phone]                                                Follow- Up Appointment:  Please call for an appointment to be seen in 2 weeks [place] - [phone]  Regional Anesthesia Blocks  1. Numbness or the inability to move the "blocked" extremity may last from 3-48 hours after placement. The length of time depends on the medication injected and your individual response to the medication. If the numbness is not going away after 48 hours, call your surgeon.  2. The extremity that is blocked will need to be protected until the numbness is gone and the  Strength has returned. Because you cannot feel it,  you will need to take extra care to avoid injury. Because it may be weak, you may have difficulty moving it or using it. You may not know what position it is in without looking at it while the block is in effect.  3. For blocks in the legs and feet, returning to weight bearing and walking needs to be done carefully. You will need to wait until the numbness is entirely gone and the strength has returned. You should be able to move your leg and foot normally before you try and bear weight or walk. You will need someone to be with you when you first try to ensure you do not fall and possibly risk injury.  4. Bruising and tenderness at the needle site are common side effects and will resolve in a few days.  5. Persistent numbness or new problems with movement should be communicated to the surgeon or the [place] [phone] [place] [phone]).  Post Anesthesia Home Care Instructions  Activity: Get plenty of rest for the remainder of the day. A responsible adult should stay with you for 24 hours following the procedure.  For the next 24 hours, DO NOT: -Drive a car -Paediatric nurse -Drink alcoholic beverages -Take any medication unless instructed by your physician -Make any legal decisions or sign important papers.  Meals: Start with liquid foods such as gelatin or soup. Progress to regular foods as tolerated. Avoid greasy, spicy, heavy foods. If nausea and/or vomiting occur, drink only clear liquids until the nausea and/or vomiting subsides. Call your physician if vomiting continues.  Special Instructions/Symptoms: Your throat may feel dry or sore from the anesthesia or the breathing tube placed in your throat during surgery. If this causes discomfort, gargle with warm salt water. The discomfort should disappear within 24 hours.  If you had a scopolamine patch placed behind your ear for the management of post- operative nausea and/or vomiting:  1. The  medication in the patch is effective for 72 hours, after which it should be removed.  Wrap patch in a tissue and discard in the trash. Wash hands thoroughly with soap and water. 2. You may remove the patch earlier than 72 hours if you experience unpleasant side effects which may include dry mouth, dizziness or visual disturbances. 3. Avoid touching the patch. Wash your hands with soap and water after contact with the patch.

## 2014-10-03 NOTE — Op Note (Signed)
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  Second assistant Carver Fila orthopedic PA-C  ANESTHESIA:   General  PREOPERATIVE INDICATIONS:  Sheri Evans is a  39 y.o. female Who had a right shoulder rotator cuff tear and failed conservative measures and elected for surgical management.  The risks benefits and alternatives were discussed with the patient preoperatively including but not limited to the risks of infection, bleeding, nerve injury, cardiopulmonary complications, the need for revision surgery, among others, and the patient was willing to proceed.We also discussed the risks for recurrent rotator cuff tear,  OPERATIVE IMPLANTS:  Incomplete relief of symptoms, among others.Arthrex bio composite 4.75 mm swivel lock 1 for the subscapularis with an inverted fiber tape, and a second anchor size 4.75 mm bio composite swivel lock from Arthrex with a posterior inverted fiber tape, and an anterior inverted fiber wire.  OPERATIVE FINDINGS: The glenohumeral articular cartilage was in good condition. The superior labrum and posterior labrum was normal. The anterior inferior glenohumeral ligament was intact. The subscapularis was torn at the upper border, with cleavage tear into the tendon, the biceps pulley itself was frayed but structurally  intact. The leading half of the supraspinatus was torn, and the tendon was extremely thick. There was moderate subacromial spurring, and fairly extensive osteoarthritis of acromioclavicular joint.  OPERATIVE PROCEDURE: The patient is brought to the operating room and placed in supine position. Gen. Anesthesia was administered. IV antibiotics were given. The right upper extremity was examined and had full motion. Time out was performed. The upper extremity was prepped and draped in usual sterile fashion and she was in a semilateral decubitus position. All bony prominences were padded. She was in 15 pounds of suspension, and diagnostic arthroscopy carried out with the above-named findings. I used the arthroscopic shaver to debride worsens of the subscapularis, as well as the undersurface of the supraspinatus, and I placed 2 anterior cannulas, and then used a 6-0 bur for the lesser tuberosity, and then used a curved suture passer to pass an inverted fiber tape through the subscapularis. This restored excellent tension and excellent tendon back to the bone.  I cut the fiber tape and went to the subacromial space.  Complete bursectomy was performed, and I released the CA ligament and performed a light tubercleplasty of the greater tuberosity, and also a light acromioplasty. I used the shaver to debride the torn ends of the supraspinatus tear. Once I had excellent visualization I used a scorpion suture passer to place a horizontal mattress suture through the tendon. Passage of the suture was actually fairly difficult because of the thickness of the tendon but I was ultimately able to get the sutures passed. I also placed a FiberWire anteriorly.  After all sutures were placed I put the tendon back down to bone using the anchor, and had excellent fixation. I touched up the acromioplasty viewing from laterally, working from posteriorly.  I then resected a centimeter of distal clavicle and confirmed appropriate  resection on multiple views.  The instruments were then removed, the shoulder drained, the portals closed with Monocryl followed by Steri-Strips and sterile gauze. She was awakened and returned to the PACU in stable and satisfactory condition. There were no complications and she tolerated the procedure well.

## 2014-10-03 NOTE — H&P (Signed)
PREOPERATIVE H&P  Chief Complaint: Right shoulder pain  HPI: Sheri Evans is a 39 y.o. female who presents for preoperative history and physical with a diagnosis of [place]. Symptoms are rated as moderate to severe, and have been worsening.  This is significantly impairing activities of daily living.  She has elected for surgical management.   Past Medical History  Diagnosis Date  . PONV (postoperative nausea and vomiting)   . Migraines   . Celiac disease   . 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 Bursitis of shoulder 09/2014    right  . Rotator cuff tear 09/2014    right shoulder  . Poison ivy 09/30/2014    posterior right knee   Past Surgical History  Procedure Laterality Date  . Cesarean section  1996, 2000, 05/07/2002    x 3  . Tonsillectomy    . Laparoscopy Right 01/23/2013    Procedure: LAPAROSCOPY OPERATIVE, Right Ovarian Cystectomy, Lysis of Adhesions;  Surgeon: Allena Katz, MD;  Location: [facility];  Service: Gynecology;  Laterality: Right;  . Abdominal hysterectomy  2013    partial  . Carpal tunnel release Right   . Cholecystectomy    . Endometrial ablation  04/30/2010  . Hysteroscopy w/d&c  12/22/2006  . Endometrial ablation w/ novasure  12/22/2006   History   Social History  . Marital Status: Married    Spouse Name: N/A  . Number of Children: N/A  . Years of Education: N/A   Social History Main Topics  . Smoking status: Never Smoker   . Smokeless tobacco: Never Used  . Alcohol Use: Yes     Comment: occasionally  . Drug Use: No  . Sexual Activity: Yes    Birth Control/ Protection: Surgical   Other Topics Concern  . None   Social History Narrative   History reviewed. No pertinent family history. Allergies  Allergen Reactions  . Penicillins Anaphylaxis  . Dilaudid [Hydromorphone Hcl] Nausea Only and Other (See  Comments)    CAUSES SEVERE ANGER  . Wheat Bran Other (See Comments)    CELIAC DISEASE   Prior to Admission medications   Medication Sig Start Date End Date Taking? Authorizing Provider  HYDROcodone-acetaminophen (NORCO/VICODIN) 5-325 MG per tablet Take 1 tablet by mouth every 6 (six) hours as needed for moderate pain.   Yes Historical Provider, MD  ibuprofen (ADVIL,MOTRIN) 200 MG tablet Take 800 mg by mouth every 6 (six) hours as needed.   Yes Historical Provider, MD  Multiple Vitamin (MULTIVITAMIN WITH MINERALS) TABS tablet Take 1 tablet by mouth daily.    Historical Provider, MD     Positive ROS: All other systems have been reviewed and were otherwise negative with the exception of those mentioned in the HPI and as above.  Physical Exam: General: Alert, no acute distress Cardiovascular: No pedal edema Respiratory: No cyanosis, no use of accessory musculature GI: No organomegaly, abdomen is soft and non-tender Skin: No lesions in the area of chief complaint Neurologic: Sensation intact distally Psychiatric: Patient is competent for consent with normal mood and affect Lymphatic: No axillary or cervical lymphadenopathy  MUSCULOSKELETAL:Right shoulder has active motion 0-180 with weakness with supraspinatus testing and pain over the acromioclavicular joint.   Assessment: Right shoulder impingement syndrome, Acromioclavicular joint arthrosis, leading edge supraspinatus tear  Plan: Plan for Procedure(s): Right shoulder arthroscopy, extensive debridement, possible biceps tenolysis, distal clavicle resection with acromioplasty and rotator cuff  repair  The risks benefits and alternatives were discussed with the patient including but not limited to the risks of nonoperative treatment, versus surgical intervention including infection, bleeding, nerve injury,  blood clots, cardiopulmonary complications, morbidity, mortality, among others, and they were willing to proceed. Recurrent rupture,  Popeye sign, among others were also discussed.  Johnny Bridge, MD Cell [phone]   10/03/2014 12:01 PM

## 2014-10-03 NOTE — Anesthesia Procedure Notes (Addendum)
Anesthesia Regional Block:  Interscalene brachial plexus block  Pre-Anesthetic Checklist: ,, timeout performed, Correct Patient, Correct Site, Correct Laterality, Correct Procedure, Correct Position, site marked, Risks and benefits discussed, Surgical consent,  Pre-op evaluation,  Post-op pain management  Laterality: Right  Prep: chloraprep       Needles:  Injection technique: Single-shot  Needle Type: Stimulator Needle - 40     Needle Length: 4cm 4 cm Needle Gauge: 22 and 22 G    Additional Needles:  Procedures: ultrasound guided (picture in chart) Interscalene brachial plexus block Narrative:  Injection made incrementally with aspirations every 5 mL.  Performed by: Personally  Anesthesiologist: Nolon Nations  Additional Notes: BP cuff, EKG monitors applied. Sedation begun. Nerve location verified with U/S. Anesthetic injected incrementally, slowly , and after neg aspirations under direct u/s guidance. Good perineural spread. Tolerated well.   Procedure Name: Intubation Date/Time: 10/03/2014 12:41 PM Performed by: Marvelene Stoneberg D Pre-anesthesia Checklist: Patient identified, Emergency Drugs available, Suction available and Patient being monitored Patient Re-evaluated:Patient Re-evaluated prior to inductionOxygen Delivery Method: Circle System Utilized Preoxygenation: Pre-oxygenation with 100% oxygen Intubation Type: IV induction Ventilation: Mask ventilation without difficulty Laryngoscope Size: Mac and 3 Grade View: Grade II Tube type: Oral Tube size: 7.0 mm Number of attempts: 1 Airway Equipment and Method: Stylet and Oral airway Placement Confirmation: ETT inserted through vocal cords under direct vision,  positive ETCO2 and breath sounds checked- equal and bilateral Secured at: 21 cm Tube secured with: Tape Dental Injury: Teeth and Oropharynx as per pre-operative assessment

## 2014-10-03 NOTE — Anesthesia Preprocedure Evaluation (Signed)
Anesthesia Evaluation  Patient identified by MRN, date of birth, ID band Patient awake    Reviewed: Allergy & Precautions, H&P , NPO status , Patient's Chart, lab work & pertinent test results, reviewed documented beta blocker date and time   History of Anesthesia Complications (+) PONV and history of anesthetic complications  Airway Mallampati: III  TM Distance: >3 FB Neck ROM: full    Dental  (+)    Pulmonary neg pulmonary ROS,  breath sounds clear to auscultation  Pulmonary exam normal       Cardiovascular negative cardio ROS  Rhythm:regular Rate:Normal     Neuro/Psych  Headaches, negative psych ROS   GI/Hepatic negative GI ROS, Neg liver ROS,   Endo/Other  Morbid obesity  Renal/GU negative Renal ROS  Female GU complaint     Musculoskeletal  (+) Arthritis -,   Abdominal   Peds  Hematology negative hematology ROS (+)   Anesthesia Other Findings   Reproductive/Obstetrics negative OB ROS                             Anesthesia Physical  Anesthesia Plan  ASA: III  Anesthesia Plan: General and Regional   Post-op Pain Management:    Induction: Intravenous  Airway Management Planned: Oral ETT  Additional Equipment: None  Intra-op Plan:   Post-operative Plan: Extubation in OR  Informed Consent: I have reviewed the patients History and Physical, chart, labs and discussed the procedure including the risks, benefits and alternatives for the proposed anesthesia with the patient or authorized representative who has indicated his/her understanding and acceptance.   Dental advisory given  Plan Discussed with: CRNA  Anesthesia Plan Comments:         Anesthesia Quick Evaluation

## 2014-10-03 NOTE — Transfer of Care (Signed)
Immediate Anesthesia Transfer of Care Note  Patient: Sheri Evans  Procedure(s) Performed: Procedure(s): RIGHT SHOULDER ARTHROSCOPY DEBRIDEMENT,ACROMIOPLASTY,DISTAL CLAVICLE EXCISION,ROTATOR CUFF REPAIR  (Right) SHOULDER ARTHROSCOPY WITH DISTAL CLAVICLE RESECTION (Right)  Patient Location: PACU  Anesthesia Type:GA combined with regional for post-op pain  Level of Consciousness: awake, alert , oriented and patient cooperative  Airway & Oxygen Therapy: Patient Spontanous Breathing and Patient connected to face mask oxygen  Post-op Assessment: Report given to RN and Post -op Vital signs reviewed and stable  Post vital signs: Reviewed and stable  Last Vitals:  Filed Vitals:   10/03/14 1527  BP:   Pulse: 82  Temp:   Resp:     Complications: No apparent anesthesia complications

## 2014-10-04 NOTE — Anesthesia Postprocedure Evaluation (Addendum)
Anesthesia Post Note  Patient: Sheri Evans  Procedure(s) Performed: Procedure(s) (LRB): RIGHT SHOULDER ARTHROSCOPY DEBRIDEMENT,ACROMIOPLASTY,DISTAL CLAVICLE EXCISION,ROTATOR CUFF REPAIR  (Right) SHOULDER ARTHROSCOPY WITH DISTAL CLAVICLE RESECTION (Right)  Anesthesia type: General  Patient location: PACU  Post pain: Pain level controlled  Post assessment: Post-op Vital signs reviewed  Last Vitals: BP 143/70 mmHg  Pulse 64  Temp(Src) 37.1 C (Oral)  Resp 20  Ht 5' 10"  (1.778 m)  Wt 258 lb 8 oz (117.255 kg)  BMI 37.09 kg/m2  SpO2 97%  LMP 05/01/2011  Post vital signs: Reviewed  Level of consciousness: sedated  Complications: No apparent anesthesia complications

## 2014-10-08 ENCOUNTER — Encounter (HOSPITAL_BASED_OUTPATIENT_CLINIC_OR_DEPARTMENT_OTHER): Payer: Self-pay | Admitting: Orthopedic Surgery

## 2014-11-13 ENCOUNTER — Ambulatory Visit: Payer: BLUE CROSS/BLUE SHIELD | Attending: Orthopedic Surgery | Admitting: Physical Therapy

## 2014-11-13 ENCOUNTER — Encounter: Payer: Self-pay | Admitting: Physical Therapy

## 2014-11-13 DIAGNOSIS — M25611 Stiffness of right shoulder, not elsewhere classified: Secondary | ICD-10-CM | POA: Diagnosis present

## 2014-11-13 DIAGNOSIS — R29898 Other symptoms and signs involving the musculoskeletal system: Secondary | ICD-10-CM | POA: Diagnosis present

## 2014-11-13 DIAGNOSIS — M25511 Pain in right shoulder: Secondary | ICD-10-CM | POA: Diagnosis not present

## 2014-11-13 NOTE — Therapy (Signed)
[facility] [address]  [place], [place], [address] Phone: [phone]   Fax:  [phone]  Physical Therapy Evaluation  Patient Details  Name: Sheri Evans MRN: [id number] Date of Birth: [birth year] Referring Provider:  Marchia Bond, MD  Encounter Date: 11/13/2014      PT End of Session - 11/13/14 1551    Visit Number 1   Number of Visits 12   Date for PT Re-Evaluation 12/25/14   PT Start Time 8032   PT Stop Time 1625   PT Time Calculation (min) 41 min      Past Medical History  Diagnosis Date  . PONV (postoperative nausea and vomiting)   . Migraines   . Celiac disease   . 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 Bursitis of shoulder 09/2014    right  . Rotator cuff tear 09/2014    right shoulder  . Poison ivy 09/30/2014    posterior right knee    Past Surgical History  Procedure Laterality Date  . Cesarean section  1996, 2000, 05/07/2002    x 3  . Tonsillectomy    . Laparoscopy Right 01/23/2013    Procedure: LAPAROSCOPY OPERATIVE, Right Ovarian Cystectomy, Lysis of Adhesions;  Surgeon: Allena Katz, MD;  Location: [facility];  Service: Gynecology;  Laterality: Right;  . Abdominal hysterectomy  2013    partial  . Carpal tunnel release Right   . Cholecystectomy    . Endometrial ablation  04/30/2010  . Hysteroscopy w/d&c  12/22/2006  . Endometrial ablation w/ novasure  12/22/2006  . Shoulder arthroscopy with rotator cuff repair and subacromial decompression Right 10/03/2014    Procedure: RIGHT SHOULDER ARTHROSCOPY DEBRIDEMENT,ACROMIOPLASTY,DISTAL CLAVICLE [place] ;  Surgeon: Marchia Bond, MD;  Location: [facility];  Service: Orthopedics;  Laterality: Right;  . Shoulder arthroscopy with distal clavicle resection Right 10/03/2014    Procedure: SHOULDER ARTHROSCOPY WITH DISTAL CLAVICLE RESECTION;  Surgeon: Marchia Bond, MD;  Location: [facility];   Service: Orthopedics;  Laterality: Right;    There were no vitals filed for this visit.  Visit Diagnosis:  Pain in joint, shoulder region, right - Plan: PT plan of care cert/re-cert  Shoulder weakness - Plan: PT plan of care cert/re-cert  Shoulder stiffness, right - Plan: PT plan of care cert/re-cert      Subjective Assessment - 11/13/14 1553    Subjective Pt s/p R RCR on 7/1/6 (6 wks tomorrow) due to several years of shoulder pain.  Following surgery pt in immobilizer 24/7 until yesterday at which time she was advised to wear sling only while public.   Patient Stated Goals have normal use of arm without pain   Currently in Pain? Yes   Pain Score --  pt rates shoulder pain 4/10 at best, AVG pain 5/10, worst pain up to 8/10.   Pain Location Shoulder   Pain Orientation Right   Pain Descriptors / Indicators Aching;Burning   Pain Onset More than a month ago   Pain Frequency Constant   Aggravating Factors  activity   Pain Relieving Factors medication, ice, rest            [facility] PT Assessment - 11/13/14 0001    Assessment   Medical Diagnosis R RCR   Onset Date/Surgical Date 10/03/14   Balance Screen   Has the patient fallen in the past 6 months No   Has the patient had a decrease in activity level  because of a fear of falling?  No   Is the patient reluctant to leave their home because of a fear of falling?  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, does require frequent foreward reaching   Leisure exercises at gym 3x/wk and walks dog, would like to be able to play softball again   Observation/Other Assessments   Focus on Therapeutic Outcomes (FOTO)  50% limitation   ROM / Strength   AROM / PROM / Strength PROM   PROM   PROM Assessment Site Shoulder   Right/Left Shoulder Right   Right Shoulder Flexion 144 Degrees   Right Shoulder ABduction 130 Degrees   Right Shoulder Internal Rotation 60  Degrees  at 90 ABD   Right Shoulder External Rotation 80 Degrees  at 90 ABD        TODAY'S TREATMENT Manual - Grade 2 AP and Caudal glides with oscillations and gentle stretching all planes TherEx - Wand B Shoulder Flexion 10x, Wand B Shoulder ER at 90ish ABD 10x, seated shoulder rolls and scapular retraction.                       PT Long Term Goals - 11/13/14 1735    PT LONG TERM GOAL #1   Title R Shoulder AROM WFL and MMT 4+/5 or better by 12/25/14   Status New   PT LONG TERM GOAL #2   Title pt able to perform all ADLs, chores, and work duties without limitation by shoulder pain, weakness, or LOM by 12/25/14   Status New   PT LONG TERM GOAL #3   Title pt able to participate in recreational activities without limitation by shoulder pain by 12/25/14   Status New               Plan - 11/13/14 1552    Clinical Impression Statement pt 6 wks s/p R RCR.  She states shoulder pain is constant in nature and rates 4/10 to 8/10.  She states most pain is noted in R upper scapula.  Pain increases with increased level of activity (not using R UE, just being up on feet and active throughout work shift).  Pt reports requiring assist with upper body dressing.  PROM values today quite good given pt just out of immobilizer yesterday.  R Shoulder PROM Flexion 144, ABD 130, ER 80, IR 60.  All PROM stopped at onset of pain.  Pt instructed in initial HEP with AAROM wand activities and scapular retraction type activities.   Pt will benefit from skilled therapeutic intervention in order to improve on the following deficits Pain;Decreased range of motion;Decreased strength   Rehab Potential Excellent   PT Frequency 2x / week   PT Duration 6 weeks   PT Treatment/Interventions Manual techniques;Therapeutic exercise;Therapeutic activities;Taping;Vasopneumatic Device;Cryotherapy;Electrical Stimulation   PT Next Visit Plan PROM and AAROM progressions, begin ER/IR with Yellow TB if well  tolerated   Consulted and Agree with Plan of Care Patient         Problem List Patient Active Problem List   Diagnosis Date Noted  . Right rotator cuff tear 10/03/2014    Edelmiro Innocent PT, OCS 11/13/2014, 5:42 PM  [facility] [address]  [place], [place], [address] Phone: [phone]   Fax:  [phone]

## 2014-11-17 ENCOUNTER — Ambulatory Visit: Payer: BLUE CROSS/BLUE SHIELD | Admitting: Rehabilitation

## 2014-11-17 DIAGNOSIS — R29898 Other symptoms and signs involving the musculoskeletal system: Secondary | ICD-10-CM

## 2014-11-17 DIAGNOSIS — M25611 Stiffness of right shoulder, not elsewhere classified: Secondary | ICD-10-CM

## 2014-11-17 DIAGNOSIS — M25511 Pain in right shoulder: Secondary | ICD-10-CM | POA: Diagnosis not present

## 2014-11-17 NOTE — Therapy (Signed)
[facility] [address]  [place], [place], [address] Phone: [phone]   Fax:  [phone]  Physical Therapy Treatment  Patient Details  Name: Sheri Evans MRN: [id number] Date of Birth: 22-Aug-1975 Referring Provider:  Marchia Bond, MD  Encounter Date: 11/17/2014      PT End of Session - 11/17/14 1358    Visit Number 2   Number of Visits 12   Date for PT Re-Evaluation 12/25/14   PT Start Time 6962   PT Stop Time 1434   PT Time Calculation (min) 38 min      Past Medical History  Diagnosis Date  . PONV (postoperative nausea and vomiting)   . Migraines   . Celiac disease   . 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 Bursitis of shoulder 09/2014    right  . Rotator cuff tear 09/2014    right shoulder  . Poison ivy 09/30/2014    posterior right knee    Past Surgical History  Procedure Laterality Date  . Cesarean section  1996, 2000, 05/07/2002    x 3  . Tonsillectomy    . Laparoscopy Right 01/23/2013    Procedure: LAPAROSCOPY OPERATIVE, Right Ovarian Cystectomy, Lysis of Adhesions;  Surgeon: Allena Katz, MD;  Location: [facility];  Service: Gynecology;  Laterality: Right;  . Abdominal hysterectomy  2013    partial  . Carpal tunnel release Right   . Cholecystectomy    . Endometrial ablation  04/30/2010  . Hysteroscopy w/d&c  12/22/2006  . Endometrial ablation w/ novasure  12/22/2006  . Shoulder arthroscopy with rotator cuff repair and subacromial decompression Right 10/03/2014    Procedure: RIGHT SHOULDER ARTHROSCOPY DEBRIDEMENT,ACROMIOPLASTY,DISTAL CLAVICLE [place] ;  Surgeon: Marchia Bond, MD;  Location: [facility];  Service: Orthopedics;  Laterality: Right;  . Shoulder arthroscopy with distal clavicle resection Right 10/03/2014    Procedure: SHOULDER ARTHROSCOPY WITH DISTAL CLAVICLE RESECTION;  Surgeon: Marchia Bond, MD;  Location: [facility];   Service: Orthopedics;  Laterality: Right;    There were no vitals filed for this visit.  Visit Diagnosis:  Pain in joint, shoulder region, right  Shoulder weakness  Shoulder stiffness, right      Subjective Assessment - 11/17/14 1357    Subjective Pt reports her shoulder is mainly sore and hasn't been having a lot of pain.    Currently in Pain? No/denies     TODAY'S TREATMENT Manual - Grade 2 AP and Caudal glides with oscillations and gentle stretching all planes Supine STM/TPR to Rt Bicep area Lt Side-Lying Scapular Mobs followed by STM to Rt UT   TherEx - Supine Wand B Shoulder Flexion 10x  Supine Wand Rt Shoulder ER at neutral 10x Supine Wand B Shoulder ER at 90ish ABD 10x Standing Row with Yellow TB 10x Standing Shoulder Extension with Yellow TB 10x Standing Pulleys Flexion 10x Supine Circles at 90 Flexion 2x10 with guarding at elbow Supine Serratus Punches x10 with guarding at elbow      PT Long Term Goals - 11/17/14 1423    PT LONG TERM GOAL #1   Title R Shoulder AROM WFL and MMT 4+/5 or better by 12/25/14   Status On-going   PT LONG TERM GOAL #2   Title pt able to perform all ADLs, chores, and work duties without limitation by shoulder pain, weakness, or LOM by 12/25/14   Status On-going   PT LONG TERM GOAL #3  Title pt able to participate in recreational activities without limitation by shoulder pain by 12/25/14   Status On-going               Plan - 11/17/14 1434    Clinical Impression Statement Pt very tender along Rt bicep and upper trap area and performed manual to this area. Continues to demonstrate good motion but noted discomfort wtih AAROM exercise.    PT Next Visit Plan PROM and AAROM progressions, continue with Yellow TB if pt tolerates well.    Consulted and Agree with Plan of Care Patient        Problem List Patient Active Problem List   Diagnosis Date Noted  . Right rotator cuff tear 10/03/2014    Barbette Hair, PTA 11/17/2014,  2:37 PM  [facility] [address]  [place], [place], [address] Phone: [phone]   Fax:  [phone]

## 2014-11-19 ENCOUNTER — Ambulatory Visit: Payer: BLUE CROSS/BLUE SHIELD | Admitting: Physical Therapy

## 2014-11-19 DIAGNOSIS — R29898 Other symptoms and signs involving the musculoskeletal system: Secondary | ICD-10-CM

## 2014-11-19 DIAGNOSIS — M25511 Pain in right shoulder: Secondary | ICD-10-CM | POA: Diagnosis not present

## 2014-11-19 DIAGNOSIS — M25611 Stiffness of right shoulder, not elsewhere classified: Secondary | ICD-10-CM

## 2014-11-19 NOTE — Therapy (Signed)
[facility] [address]  [place], [place], [address] Phone: [phone]   Fax:  [phone]  Physical Therapy Treatment  Patient Details  Name: Sheri Evans MRN: [id number] Date of Birth: [birth year] Referring Provider:  Marchia Bond, MD  Encounter Date: 11/19/2014      PT End of Session - 11/19/14 1319    Visit Number 3   Number of Visits 12   Date for PT Re-Evaluation 12/25/14   PT Start Time 1316   PT Stop Time 1406   PT Time Calculation (min) 50 min      Past Medical History  Diagnosis Date  . PONV (postoperative nausea and vomiting)   . Migraines   . Celiac disease   . 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 Bursitis of shoulder 09/2014    right  . Rotator cuff tear 09/2014    right shoulder  . Poison ivy 09/30/2014    posterior right knee    Past Surgical History  Procedure Laterality Date  . Cesarean section  1996, 2000, 05/07/2002    x 3  . Tonsillectomy    . Laparoscopy Right 01/23/2013    Procedure: LAPAROSCOPY OPERATIVE, Right Ovarian Cystectomy, Lysis of Adhesions;  Surgeon: Allena Katz, MD;  Location: [facility];  Service: Gynecology;  Laterality: Right;  . Abdominal hysterectomy  2013    partial  . Carpal tunnel release Right   . Cholecystectomy    . Endometrial ablation  04/30/2010  . Hysteroscopy w/d&c  12/22/2006  . Endometrial ablation w/ novasure  12/22/2006  . Shoulder arthroscopy with rotator cuff repair and subacromial decompression Right 10/03/2014    Procedure: RIGHT SHOULDER ARTHROSCOPY DEBRIDEMENT,ACROMIOPLASTY,DISTAL CLAVICLE [place] ;  Surgeon: Marchia Bond, MD;  Location: [facility];  Service: Orthopedics;  Laterality: Right;  . Shoulder arthroscopy with distal clavicle resection Right 10/03/2014    Procedure: SHOULDER ARTHROSCOPY WITH DISTAL CLAVICLE RESECTION;  Surgeon: Marchia Bond, MD;  Location: [facility];   Service: Orthopedics;  Laterality: Right;    There were no vitals filed for this visit.  Visit Diagnosis:  Pain in joint, shoulder region, right  Shoulder weakness  Shoulder stiffness, right      Subjective Assessment - 11/19/14 1318    Subjective states notes pain and popping sensation in anterior R shoulder with pendulum exercises.         TODAY'S TREATMENT TherEx - NuStep LE + UE lvl 3, 3'  Manual - gentle STM R prox bicep tendon due to c/o chief pain from this area and TTP noted 4 strip kinesiotape to R Shoulder: 30% anterior and posterior delt, 75% anterior shoulder over bicep tendon, 50% lateral delt into upper trap  TherEx - supine rhythmic stab 2x30" Supine serratus Press 3# 15x (noted mild posterior scap pain) L Side-Lying R ER AROM 2x10 L Side-Lying scap ABD in 90 ABD 10x L Side-Lying R Shoulder ADD Red TB 10x (into comfortable ABD ROM) L Side-Lying R Horiz ABD AROM 10x Pulleys Flexion and Scaption 20x each (very good ROM) Hand on Wall Shoulder Flexion 10x (full ROM) Hand on Wall CW and CCW overhead 15x each Standing ER and IR at 0 ABD Yellow TB 15x each Standing R Shoulder Ext Yellow TX 15x              PT Long Term Goals - 11/17/14 1423    PT LONG TERM GOAL #1   Title R Shoulder  AROM WFL and MMT 4+/5 or better by 12/25/14   Status On-going   PT LONG TERM GOAL #2   Title pt able to perform all ADLs, chores, and work duties without limitation by shoulder pain, weakness, or LOM by 12/25/14   Status On-going   PT LONG TERM GOAL #3   Title pt able to participate in recreational activities without limitation by shoulder pain by 12/25/14   Status On-going               Plan - 11/19/14 1417    Clinical Impression Statement pt with excellent ROM to date (ER to 90 and Flexion near WNL with wall slides and pulleys).  Applied tape today as trial to assist with intermittent pain noted in shoulder.  Progress HEP.   PT Next Visit Plan AAROM and AROM  progressions, scap retraction training   Consulted and Agree with Plan of Care Patient        Problem List Patient Active Problem List   Diagnosis Date Noted  . Right rotator cuff tear 10/03/2014    Caniya Tagle PT, OCS 11/19/2014, 2:19 PM  [facility] [address]  [place], [place], [address] Phone: [phone]   Fax:  [phone]

## 2014-11-24 ENCOUNTER — Encounter: Payer: BLUE CROSS/BLUE SHIELD | Admitting: Physical Therapy

## 2014-11-25 ENCOUNTER — Ambulatory Visit: Payer: BLUE CROSS/BLUE SHIELD | Admitting: Physical Therapy

## 2014-11-25 DIAGNOSIS — M25611 Stiffness of right shoulder, not elsewhere classified: Secondary | ICD-10-CM

## 2014-11-25 DIAGNOSIS — M25511 Pain in right shoulder: Secondary | ICD-10-CM

## 2014-11-25 DIAGNOSIS — R29898 Other symptoms and signs involving the musculoskeletal system: Secondary | ICD-10-CM

## 2014-11-25 NOTE — Therapy (Signed)
[facility] [address]  [place], [place], [address] Phone: [phone]   Fax:  [phone]  Physical Therapy Treatment  Patient Details  Name: Sheri Evans MRN: [id number] Date of Birth: [birth year] Referring Provider:  Marchia Bond, MD  Encounter Date: 11/25/2014      PT End of Session - 11/25/14 1403    Visit Number 4   Number of Visits 12   Date for PT Re-Evaluation 12/25/14   PT Start Time 4854   PT Stop Time 1500   PT Time Calculation (min) 55 min      Past Medical History  Diagnosis Date  . PONV (postoperative nausea and vomiting)   . Migraines   . Celiac disease   . 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 Bursitis of shoulder 09/2014    right  . Rotator cuff tear 09/2014    right shoulder  . Poison ivy 09/30/2014    posterior right knee    Past Surgical History  Procedure Laterality Date  . Cesarean section  1996, 2000, 05/07/2002    x 3  . Tonsillectomy    . Laparoscopy Right 01/23/2013    Procedure: LAPAROSCOPY OPERATIVE, Right Ovarian Cystectomy, Lysis of Adhesions;  Surgeon: Allena Katz, MD;  Location: [facility];  Service: Gynecology;  Laterality: Right;  . Abdominal hysterectomy  2013    partial  . Carpal tunnel release Right   . Cholecystectomy    . Endometrial ablation  04/30/2010  . Hysteroscopy w/d&c  12/22/2006  . Endometrial ablation w/ novasure  12/22/2006  . Shoulder arthroscopy with rotator cuff repair and subacromial decompression Right 10/03/2014    Procedure: RIGHT SHOULDER ARTHROSCOPY DEBRIDEMENT,ACROMIOPLASTY,DISTAL CLAVICLE [place] ;  Surgeon: Marchia Bond, MD;  Location: [facility];  Service: Orthopedics;  Laterality: Right;  . Shoulder arthroscopy with distal clavicle resection Right 10/03/2014    Procedure: SHOULDER ARTHROSCOPY WITH DISTAL CLAVICLE RESE[place];  Surgeon: Marchia Bond, MD;  Location: [facility];   Service: Orthopedics;  Laterality: Right;    There were no vitals filed for this visit.  Visit Diagnosis:  Pain in joint, shoulder region, right  Shoulder weakness  Shoulder stiffness, right      Subjective Assessment - 11/25/14 1405    Subjective States worked a lot Friday and woke that night with great deal of spasms/cramping throughout R shoulder and scapula.  Took pain meds and felt better and was able to get back to sleep a few hours later.  Has been performing updated HEP.   Currently in Pain? Yes   Pain Score 4   3-4/10   Pain Location Shoulder            [facility] PT Assessment - 11/25/14 0001    PROM   Right/Left Shoulder Right   Right Shoulder Flexion 155 Degrees   Right Shoulder ABduction 160 Degrees   Right Shoulder Internal Rotation 68 Degrees   Right Shoulder External Rotation 100 Degrees        TODAY'S TREATMENT TherEx - UBE lvl 1.0 1'/1'  Manual - R GH AP and Caudal glides grade 3 with mobes into all planes (PROM assessment as well)  TherEx - 3-way prayer stretch  4 strip kinesiotape to R Shoulder: 30% anterior and posterior delt, 75% anterior shoulder over bicep tendon, 50% lateral delt into upper trap  Supine serratus Press 3# 15x (noted mild posterior scap pain) L Side-Lying R ER 1# 20x L  Side-Lying R Horiz ABD 1# 10x L Side-Lying R ABD 1# 10x L Side-Lying R CW/CCW 90 ABD 2# 15x each Hand on Wall Shoulder Flexion 2# 15x (full ROM) Body Blade B Flexion 2x20", R ABD/ADD 2x20" (very difficult), R ER/IR 2x20"  Vasopneumatic compression - R Shoulder, Low, 15', 38dg           PT Long Term Goals - 11/25/14 1449    PT LONG TERM GOAL #1   Title R Shoulder AROM WFL and MMT 4+/5 or better by 12/25/14   Status On-going   PT LONG TERM GOAL #2   Title pt able to perform all ADLs, chores, and work duties without limitation by shoulder pain, weakness, or LOM by 12/25/14   Status On-going   PT LONG TERM GOAL #3   Title pt able to participate in  recreational activities without limitation by shoulder pain by 12/25/14   Status On-going               Plan - 11/25/14 1447    Clinical Impression Statement excellent improvement in PROM and good tolerance with today's progressions.  Pt reports benefit with taping to shoulder.   PT Next Visit Plan AROM progressions, light RC and scap retraction resistance training; manual and modalities PRN   Consulted and Agree with Plan of Care Patient        Problem List Patient Active Problem List   Diagnosis Date Noted  . Right rotator cuff tear 10/03/2014    Elan Brainerd PT, OCS 11/25/2014, 2:50 PM  [facility] [address]  [place], [place], [address] Phone: [phone]   Fax:  [phone]

## 2014-11-26 ENCOUNTER — Encounter: Payer: BLUE CROSS/BLUE SHIELD | Admitting: Rehabilitation

## 2014-11-27 ENCOUNTER — Ambulatory Visit: Payer: BLUE CROSS/BLUE SHIELD | Admitting: Rehabilitation

## 2014-11-27 DIAGNOSIS — M25511 Pain in right shoulder: Secondary | ICD-10-CM

## 2014-11-27 DIAGNOSIS — M25611 Stiffness of right shoulder, not elsewhere classified: Secondary | ICD-10-CM

## 2014-11-27 DIAGNOSIS — R29898 Other symptoms and signs involving the musculoskeletal system: Secondary | ICD-10-CM

## 2014-11-27 NOTE — Therapy (Signed)
[facility] [address]  [place], [place], [address] Phone: [phone]   Fax:  [phone]  Physical Therapy Treatment  Patient Details  Name: Sheri Evans MRN: [id number] Date of Birth: 04-02-1976 Referring Provider:  Marchia Bond, MD  Encounter Date: 11/27/2014      PT End of Session - 11/27/14 1313    Visit Number 5   Number of Visits 12   Date for PT Re-Evaluation 12/25/14   PT Start Time 9323   PT Stop Time 1408   PT Time Calculation (min) 53 min      Past Medical History  Diagnosis Date  . PONV (postoperative nausea and vomiting)   . Migraines   . Celiac disease   . 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 Bursitis of shoulder 09/2014    right  . Rotator cuff tear 09/2014    right shoulder  . Poison ivy 09/30/2014    posterior right knee    Past Surgical History  Procedure Laterality Date  . Cesarean section  1996, 2000, 05/07/2002    x 3  . Tonsillectomy    . Laparoscopy Right 01/23/2013    Procedure: LAPAROSCOPY OPERATIVE, Right Ovarian Cystectomy, Lysis of Adhesions;  Surgeon: Allena Katz, MD;  Location: [facility];  Service: Gynecology;  Laterality: Right;  . Abdominal hysterectomy  2013    partial  . Carpal tunnel release Right   . Cholecystectomy    . Endometrial ablation  04/30/2010  . Hysteroscopy w/d&c  12/22/2006  . Endometrial ablation w/ novasure  12/22/2006  . Shoulder arthroscopy with rotator cuff repair and subacromial decompression Right 10/03/2014    Procedure: RIGHT SHOULDER ARTHROSCOPY DEBRIDEMENT,ACROMIOPLASTY,DISTAL CLAVICLE [place] ;  Surgeon: Marchia Bond, MD;  Location: [facility];  Service: Orthopedics;  Laterality: Right;  . Shoulder arthroscopy with distal clavicle resection Right 10/03/2014    Procedure: SHOULDER ARTHROSCOPY WITH DISTAL CLAVICLE RESECTION;  Surgeon: Marchia Bond, MD;  Location: [facility];   Service: Orthopedics;  Laterality: Right;    There were no vitals filed for this visit.  Visit Diagnosis:  Pain in joint, shoulder region, right  Shoulder weakness  Shoulder stiffness, right      Subjective Assessment - 11/27/14 1318    Subjective Reports her shoulder was sore after Tuesday and is still sore today. Notes pain near bicep and collar bone area.    Currently in Pain? Yes   Pain Score 5    Pain Location Shoulder  bicep area   Pain Orientation Right   Pain Descriptors / Indicators Constant;Tightness       TODAY'S TREATMENT TherEx - UBE lvl 1.0 90"/90"  Manual - R GH AP and Caudal glides grade 3 with mobes into all planes, gentle PROM in all directions Gentle STM to Rt bicep/pec area due to tenderness/soreness reported here  TherEx - L Side-Lying R Horiz ABD 0# 10x3", 1# 12x L Side-Lying R Flexion 0# 10x (in comfortable ROM)  L Side-Lying R ER 2# 10x L Side-Lying R ABD 1# 15x L Side-Lying R CW/CCW 90 ABD 2# 20x each 3-way prayer stretch Body Blade B Flexion 2x30", R ABD/ADD 2x30" (very difficult), R ER/IR 2x30" Hand on Wall CW and CCW overhead 15x each  Vasopneumatic compression - R Shoulder, Low, 15', 38dg          PT Long Term Goals - 11/25/14 1449    PT LONG TERM GOAL #1  Title R Shoulder AROM WFL and MMT 4+/5 or better by 12/25/14   Status On-going   PT LONG TERM GOAL #2   Title pt able to perform all ADLs, chores, and work duties without limitation by shoulder pain, weakness, or LOM by 12/25/14   Status On-going   PT LONG TERM GOAL #3   Title pt able to participate in recreational activities without limitation by shoulder pain by 12/25/14   Status On-going               Plan - 11/27/14 1347    Clinical Impression Statement Pt continues to report good relief with tape but didn't want to change it since it was just placed Tuesday. Slight pain with side-lying flexion exercise and instructed pt to keep in painfree ROM. Noted  tightness/discomfort with body blade activity today in her bicep area. Returned to [facility] to bicep today due to pt reporting tightness and soreness.    PT Next Visit Plan AROM progressions, light RC and scap retraction resistance training; manual and modalities PRN   Consulted and Agree with Plan of Care Patient        Problem List Patient Active Problem List   Diagnosis Date Noted  . Right rotator cuff tear 10/03/2014    Barbette Hair, PTA 11/27/2014, 1:50 PM  [facility] [address]  [place], [place], [address] Phone: [phone]   Fax:  [phone]

## 2014-12-01 ENCOUNTER — Ambulatory Visit: Payer: BLUE CROSS/BLUE SHIELD | Admitting: Physical Therapy

## 2014-12-01 DIAGNOSIS — M25511 Pain in right shoulder: Secondary | ICD-10-CM

## 2014-12-01 DIAGNOSIS — M25611 Stiffness of right shoulder, not elsewhere classified: Secondary | ICD-10-CM

## 2014-12-01 DIAGNOSIS — R29898 Other symptoms and signs involving the musculoskeletal system: Secondary | ICD-10-CM

## 2014-12-01 NOTE — Therapy (Signed)
[facility] [address]  [place], [place], [address] Phone: [phone]   Fax:  [phone]  Physical Therapy Treatment  Patient Details  Name: Sheri Evans MRN: [id number] Date of Birth: 11-03-1975 Referring Provider:  Marchia Bond, MD  Encounter Date: 12/01/2014      PT End of Session - 12/01/14 1418    Visit Number 6   Number of Visits 12   Date for PT Re-Evaluation 12/25/14   PT Start Time 1408   PT Stop Time 1508   PT Time Calculation (min) 60 min      Past Medical History  Diagnosis Date  . PONV (postoperative nausea and vomiting)   . Migraines   . Celiac disease   . 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 Bursitis of shoulder 09/2014    right  . Rotator cuff tear 09/2014    right shoulder  . Poison ivy 09/30/2014    posterior right knee    Past Surgical History  Procedure Laterality Date  . Cesarean section  1996, 2000, 05/07/2002    x 3  . Tonsillectomy    . Laparoscopy Right 01/23/2013    Procedure: LAPAROSCOPY OPERATIVE, Right Ovarian Cystectomy, Lysis of Adhesions;  Surgeon: Allena Katz, MD;  Location: [facility];  Service: Gynecology;  Laterality: Right;  . Abdominal hysterectomy  2013    partial  . Carpal tunnel release Right   . Cholecystectomy    . Endometrial ablation  04/30/2010  . Hysteroscopy w/d&c  12/22/2006  . Endometrial ablation w/ novasure  12/22/2006  . Shoulder arthroscopy with rotator cuff repair and subacromial decompression Right 10/03/2014    Procedure: RIGHT SHOULDER ARTHROSCOPY DEBRIDEMENT,ACROMIOPLASTY,DISTAL CLAVICLE [place] ;  Surgeon: Marchia Bond, MD;  Location: [facility];  Service: Orthopedics;  Laterality: Right;  . Shoulder arthroscopy with distal clavicle resection Right 10/03/2014    Procedure: SHOULDER ARTHROSCOPY WITH DISTAL CLAVICLE RESECTION;  Surgeon: Marchia Bond, MD;  Location: [facility];   Service: Orthopedics;  Laterality: Right;    There were no vitals filed for this visit.  Visit Diagnosis:  Pain in joint, shoulder region, right  Shoulder weakness  Shoulder stiffness, right      Subjective Assessment - 12/01/14 1412    Subjective states had a massage on Saturday and this got some of the "knots out".  States tape seems to help, especially with sleeping.   Currently in Pain? Yes   Pain Score 3    Pain Location Shoulder   Pain Orientation Right           [facility] PT Assessment - 12/01/14 0001    Assessment   Next MD Visit 12/10/14       TODAY'S TREATMENT TherEx - UBE lvl 1.0 60"/60" Pulleys Flexion and ABD 15x each Corner Pec Stretch 3x20" Corner Pushout 10x Cord IR behind back stretch 5x10" Hand on Wall Shoulder Flexion 3# 15x Low Row 20# 15x, 25# 15x Body Blade B Flexion 2x20", R ABD/ADD 2x20", R ER/IR 2x20" Narrow Pulldown 15# 12x (easy, great ROM) Standing R ER at 90 ABD Yellow TB 2x10 Standing Y Yellow TB 10x  Kinesiotape R Shoulder - 4 strips: 30% anterior and posterior delt, 75% lateral and anterior shoulder (subacromial space and prox biceps tendon), 50% lateral delt into upper trap  Vasopneumatic compression - R Shoulder, Low Pressure, 15', 38dg             PT Long  Term Goals - 12/01/14 1458    PT LONG TERM GOAL #1   Title R Shoulder AROM WFL and MMT 4+/5 or better by 12/25/14   Status On-going   PT LONG TERM GOAL #2   Title pt able to perform all ADLs, chores, and work duties without limitation by shoulder pain, weakness, or LOM by 12/25/14   Status On-going   PT LONG TERM GOAL #3   Title pt able to participate in recreational activities without limitation by shoulder pain by 12/25/14   Status On-going               Plan - 12/01/14 1456    Clinical Impression Statement pt is progressing exceptionally well to date and seems as though will meet PT goals prior to expection at initial eval.   PT Next Visit Plan Assess AROM and  perform standing AROM and light PRE progressions, continue RC and scap retraction resistance training; manual and modalities PRN   Consulted and Agree with Plan of Care Patient        Problem List Patient Active Problem List   Diagnosis Date Noted  . Right rotator cuff tear 10/03/2014    Sheyla Zaffino PT, OCS 12/01/2014, 3:12 PM  [facility] [address]  [place], [place], [address] Phone: [phone]   Fax:  [phone]

## 2014-12-03 ENCOUNTER — Ambulatory Visit: Payer: BLUE CROSS/BLUE SHIELD | Admitting: Rehabilitation

## 2014-12-09 ENCOUNTER — Ambulatory Visit: Payer: BLUE CROSS/BLUE SHIELD | Attending: Orthopedic Surgery | Admitting: Physical Therapy

## 2014-12-09 DIAGNOSIS — M25511 Pain in right shoulder: Secondary | ICD-10-CM | POA: Insufficient documentation

## 2014-12-09 DIAGNOSIS — R29898 Other symptoms and signs involving the musculoskeletal system: Secondary | ICD-10-CM | POA: Diagnosis present

## 2014-12-09 DIAGNOSIS — M25611 Stiffness of right shoulder, not elsewhere classified: Secondary | ICD-10-CM | POA: Diagnosis present

## 2014-12-09 NOTE — Therapy (Signed)
[facility] [address]  [place], [place], [address] Phone: [phone]   Fax:  [phone]  Physical Therapy Treatment  Patient Details  Name: Sheri Evans MRN: [id number] Date of Birth: [birth year] Referring Provider:  Marchia Bond, MD  Encounter Date: 12/09/2014      PT End of Session - 12/09/14 1457    Visit Number 7   Number of Visits 12   Date for PT Re-Evaluation 12/25/14   PT Start Time 1410   PT Stop Time 1510   PT Time Calculation (min) 60 min      Past Medical History  Diagnosis Date  . PONV (postoperative nausea and vomiting)   . Migraines   . Celiac disease   . 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 Bursitis of shoulder 09/2014    right  . Rotator cuff tear 09/2014    right shoulder  . Poison ivy 09/30/2014    posterior right knee    Past Surgical History  Procedure Laterality Date  . Cesarean section  1996, 2000, 05/07/2002    x 3  . Tonsillectomy    . Laparoscopy Right 01/23/2013    Procedure: LAPAROSCOPY OPERATIVE, Right Ovarian Cystectomy, Lysis of Adhesions;  Surgeon: Allena Katz, MD;  Location: [facility];  Service: Gynecology;  Laterality: Right;  . Abdominal hysterectomy  2013    partial  . Carpal tunnel release Right   . Cholecystectomy    . Endometrial ablation  04/30/2010  . Hysteroscopy w/d&c  12/22/2006  . Endometrial ablation w/ novasure  12/22/2006  . Shoulder arthroscopy with rotator cuff repair and subacromial decompression Right 10/03/2014    Procedure: RIGHT SHOULDER ARTHROSCOPY DEBRIDEMENT,ACROMIOPLASTY,DISTAL CLAVICLE [place] ;  Surgeon: Marchia Bond, MD;  Location: [facility];  Service: Orthopedics;  Laterality: Right;  . Shoulder arthroscopy with distal clavicle resection Right 10/03/2014    Procedure: SHOULDER ARTHROSCOPY WITH DISTAL CLAVICLE RESECTION;  Surgeon: Marchia Bond, MD;  Location: [facility];   Service: Orthopedics;  Laterality: Right;    There were no vitals filed for this visit.  Visit Diagnosis:  Pain in joint, shoulder region, right  Shoulder weakness  Shoulder stiffness, right      Subjective Assessment - 12/09/14 1412    Subjective states worked out in yard some this AM and exercised at gym over the weekend.  States did not note pain during exercises but is sore today to front of R shoulder which is where she states pain is typically located.   Currently in Pain? Yes   Pain Score 3    Pain Location Shoulder   Pain Orientation Right;Anterior            [facility] PT Assessment - 12/09/14 0001    ROM / Strength   AROM / PROM / Strength AROM;Strength   AROM   AROM Assessment Site Shoulder   Right/Left Shoulder Right   Right Shoulder Flexion 155 Degrees   Right Shoulder ABduction 160 Degrees   Right Shoulder Internal Rotation 45 Degrees  reach to R upper glute (L to T6)   Right Shoulder External Rotation 90 Degrees  Reach to T3 (L to T4)   Strength   Strength Assessment Site Shoulder   Right/Left Shoulder Right   Right Shoulder Flexion 4/5   Right Shoulder Extension 4/5   Right Shoulder ABduction 4/5   Right Shoulder Internal Rotation 4-/5   Right Shoulder External Rotation 4+/5  TODAY'S TREATMENT TherEx - UBE lvl 2.5 60"/60" Hand on Wall Flexion Slide 2# 15x OH Hand on Wall CW/CCW 2# 15x each  Manual - STM / TPR R prox bicep and pec minor  TherEx - Standing IR towel Stretch 4x10" Corner Pec stretch 3x20" Standing Back to Wall with 1/2 FR along spine B Shoulder Flexion 1# 12x, B Shoulder ABD 1# 12x, B ER Red TB 15x, UE Flexion with Contra Ext Red TB 10x each Low Row 25# 15x, 35# 15x Body Blade B Flexion 2x20", R ABD/ADD 2x20", R ER/IR 2x20" Tricep Pushdown 15# Standing Y Yellow TB 15x  Kinesiotape R Shoulder - 4 strips: 30% anterior and posterior delt, 75% lateral and anterior shoulder (subacromial space and prox biceps tendon), 50% lateral  delt into upper trap  Vasopneumatic compression - R Shoulder, Low Pressure, 15', 38dg                            PT Long Term Goals - 12/09/14 1802    PT LONG TERM GOAL #1   Title R Shoulder AROM WFL and MMT 4+/5 or better by 12/25/14  AROM is WFL most plane, ER is 4+/5   Status Partially Met   PT LONG TERM GOAL #2   Title pt able to perform all ADLs, chores, and work duties without limitation by shoulder pain, weakness, or LOM by 12/25/14   Status On-going   PT LONG TERM GOAL #3   Title pt able to participate in recreational activities without limitation by shoulder pain by 12/25/14   Status On-going               Plan - 12/09/14 1458    Clinical Impression Statement pt progressing exceptionally well overall but continued c/o anterior shoulder pain in area of prox biceps tendon and lateral pec minor.  PROM and AROM is basically [facility] other than IR which is limited with c/o increased anterior shoulder pain.  MMT also quite good rating 4/5 in all planes other than ER which is 4+/5 and IR 4-/5 with mild shoulder pain noted.  Pt has attended 7 of 12 visits on POC and I anticipate she will meet all goals by 12th visit or sooner.   PT Next Visit Plan standing AROM and light PRE progressions, continue RC and scap retraction resistance training; manual and modalities PRN   Consulted and Agree with Plan of Care Patient        Problem List Patient Active Problem List   Diagnosis Date Noted  . Right rotator cuff tear 10/03/2014    Zanyiah Posten PT, OCS 12/09/2014, 6:03 PM  [facility] [address]  [place], [place], [address] Phone: [phone]   Fax:  [phone]

## 2014-12-11 ENCOUNTER — Ambulatory Visit: Payer: BLUE CROSS/BLUE SHIELD | Admitting: Rehabilitation

## 2014-12-11 DIAGNOSIS — M25511 Pain in right shoulder: Secondary | ICD-10-CM | POA: Diagnosis not present

## 2014-12-11 DIAGNOSIS — M25611 Stiffness of right shoulder, not elsewhere classified: Secondary | ICD-10-CM

## 2014-12-11 DIAGNOSIS — R29898 Other symptoms and signs involving the musculoskeletal system: Secondary | ICD-10-CM

## 2014-12-11 NOTE — Therapy (Signed)
[facility] [address]  [place], [place], [address] Phone: [phone]   Fax:  [phone]  Physical Therapy Treatment  Patient Details  Name: Sheri Evans MRN: [id number] Date of Birth: Dec 29, 1975 Referring Provider:  Marchia Bond, MD  Encounter Date: 12/11/2014      PT End of Session - 12/11/14 1356    Visit Number 8   Number of Visits 12   Date for PT Re-Evaluation 12/25/14   PT Start Time 3785   PT Stop Time 1450   PT Time Calculation (min) 54 min      Past Medical History  Diagnosis Date  . PONV (postoperative nausea and vomiting)   . Migraines   . Celiac disease   . 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 Bursitis of shoulder 09/2014    right  . Rotator cuff tear 09/2014    right shoulder  . Poison ivy 09/30/2014    posterior right knee    Past Surgical History  Procedure Laterality Date  . Cesarean section  1996, 2000, 05/07/2002    x 3  . Tonsillectomy    . Laparoscopy Right 01/23/2013    Procedure: LAPAROSCOPY OPERATIVE, Right Ovarian Cystectomy, Lysis of Adhesions;  Surgeon: Allena Katz, MD;  Location: [facility];  Service: Gynecology;  Laterality: Right;  . Abdominal hysterectomy  2013    partial  . Carpal tunnel release Right   . Cholecystectomy    . Endometrial ablation  04/30/2010  . Hysteroscopy w/d&c  12/22/2006  . Endometrial ablation w/ novasure  12/22/2006  . Shoulder arthroscopy with rotator cuff repair and subacromial decompression Right 10/03/2014    Procedure: RIGHT SHOULDER ARTHROSCOPY DEBRIDEMENT,ACROMIOPLASTY,DISTAL CLAVICLE [place] ;  Surgeon: Marchia Bond, MD;  Location: [facility];  Service: Orthopedics;  Laterality: Right;  . Shoulder arthroscopy with distal clavicle resection Right 10/03/2014    Procedure: SHOULDER ARTHROSCOPY WITH DISTAL CLAVICLE RESECTION;  Surgeon: Marchia Bond, MD;  Location: [facility];   Service: Orthopedics;  Laterality: Right;    There were no vitals filed for this visit.  Visit Diagnosis:  Pain in joint, shoulder region, right  Shoulder weakness  Shoulder stiffness, right      Subjective Assessment - 12/11/14 1358    Subjective Reports her shoulder kept her up all night last night. Unknown reason but states pain went from her shoulder down to her elbow.    Currently in Pain? Yes   Pain Score 5    Pain Location Shoulder   Pain Orientation Right;Anterior      TODAY'S TREATMENT TherEx - UBE lvl 2.5 90"/90"  Manual - STM / TPR R prox bicep and pec minor  TherEx - Corner Pec stretch 3x20"  Standing IR towel Stretch 5x10" Circles on wall at 90 degrees flexion CW/CCW 2# 15x each OH Hand on Wall CW/CCW 2# 15x each Hand on Wall Flexion Slide 2# 15x Standing R ER at 90 ABD Yellow TB x15 Standing Rt IR at 59 ABD Yellow TB x15 Standing Y Yellow TB 15x Low Row 35# 2x15  Vasopneumatic compression - R Shoulder, Low Pressure, 15', 38dg       PT Long Term Goals - 12/11/14 1434    PT LONG TERM GOAL #1   Title R Shoulder AROM WFL and MMT 4+/5 or better by 12/25/14   Status Partially Met   PT LONG TERM GOAL #2   Title pt able to perform all  ADLs, chores, and work duties without limitation by shoulder pain, weakness, or LOM by 12/25/14   Status On-going   PT LONG TERM GOAL #3   Title pt able to participate in recreational activities without limitation by shoulder pain by 12/25/14   Status On-going               Plan - 12/11/14 1429    Clinical Impression Statement Held on tape today due to pt reporting irriatation last night and removing due to itchy feeling. Looking at the skin today, noticed slight red rash and agreed with pt to hold tape and allow the skin to relax. Very TTP along pec and bicep today with more manual being done. Return to exercises as pt is able.    PT Next Visit Plan standing AROM and light PRE progressions, continue RC and scap  retraction resistance training; manual and modalities PRN   Consulted and Agree with Plan of Care Patient        Problem List Patient Active Problem List   Diagnosis Date Noted  . Right rotator cuff tear 10/03/2014    Barbette Hair, PTA 12/11/2014, 2:51 PM  [facility] [address]  [place], [place], [address] Phone: [phone]   Fax:  [phone]

## 2014-12-15 ENCOUNTER — Ambulatory Visit: Payer: BLUE CROSS/BLUE SHIELD | Admitting: Physical Therapy

## 2014-12-15 DIAGNOSIS — M25511 Pain in right shoulder: Secondary | ICD-10-CM | POA: Diagnosis not present

## 2014-12-15 DIAGNOSIS — R29898 Other symptoms and signs involving the musculoskeletal system: Secondary | ICD-10-CM

## 2014-12-15 NOTE — Therapy (Signed)
[facility] [address]  [place], [place], [address] Phone: [phone]   Fax:  [phone]  Physical Therapy Treatment  Patient Details  Name: Sheri Evans MRN: [id number] Date of Birth: 02-03-1976 Referring Provider:  Marchia Bond, MD  Encounter Date: 12/15/2014      PT End of Session - 12/15/14 1109    Visit Number 9   Number of Visits 12   Date for PT Re-Evaluation 12/25/14   PT Start Time 1108   PT Stop Time 1211   PT Time Calculation (min) 63 min      Past Medical History  Diagnosis Date  . PONV (postoperative nausea and vomiting)   . Migraines   . Celiac disease   . 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 Bursitis of shoulder 09/2014    right  . Rotator cuff tear 09/2014    right shoulder  . Poison ivy 09/30/2014    posterior right knee    Past Surgical History  Procedure Laterality Date  . Cesarean section  1996, 2000, 05/07/2002    x 3  . Tonsillectomy    . Laparoscopy Right 01/23/2013    Procedure: LAPAROSCOPY OPERATIVE, Right Ovarian Cystectomy, Lysis of Adhesions;  Surgeon: Allena Katz, MD;  Location: [facility];  Service: Gynecology;  Laterality: Right;  . Abdominal hysterectomy  2013    partial  . Carpal tunnel release Right   . Cholecystectomy    . Endometrial ablation  04/30/2010  . Hysteroscopy w/d&c  12/22/2006  . Endometrial ablation w/ novasure  12/22/2006  . Shoulder arthroscopy with rotator cuff repair and subacromial decompression Right 10/03/2014    Procedure: RIGHT SHOULDER ARTHROSCOPY DEBRIDEMENT,ACROMIOPLASTY,DISTAL CLAVICLE [place] ;  Surgeon: Marchia Bond, MD;  Location: [facility];  Service: Orthopedics;  Laterality: Right;  . Shoulder arthroscopy with distal clavicle resection Right 10/03/2014    Procedure: SHOULDER ARTHROSCOPY WITH DISTAL CLAVICLE RESECTION;  Surgeon: Marchia Bond, MD;  Location: [facility];   Service: Orthopedics;  Laterality: Right;    There were no vitals filed for this visit.  Visit Diagnosis:  Pain in joint, shoulder region, right  Shoulder weakness      Subjective Assessment - 12/15/14 1110    Subjective states had busy weekend at restaurant and states shoulder was sore (up to 6/10 yesterday) but was able to sleep well and pain 3-4/10 today.   Currently in Pain? Yes   Pain Score 4   3-4/10 today   Pain Location Shoulder   Pain Orientation Right;Anterior   Pain Descriptors / Indicators Burning   Pain Frequency Intermittent             TODAY'S TREATMENT TherEx - UBE lvl 2.0 90"/90"  Manual - R Shoulder grade 2 and 3 AP and caudal glides (full ROM but notes catch with Flexion)  TherEx - L Side-Lying R ER 2# 20x, 3# 20x L Side-Lying R ABD 15-45dg 3# 20x then 15x full ROM L Side-Lying R Horiz ABD 3# 15x Low Row 35# 20x Standing Y Yellow TB 15x High Row 25# 15x Narrow Pulldown 25# 15x Standing Back to Wall with Foam Roll along spine B Shoulder ABD 2# 10x, Flexion 2# 10x Staggered Standing Elbows on PBall (65cm) scap protraction with B Shoulder Flexion 15x Staggered Standing One Arm Row Double Black TB 15x Staggered Standing Punch Diagonal Double Black TB 15x B UE OH ball bouncing against wall, 2# B wrists, 70"  4 strips kinesiotape to R shoulder (pt requested) - 30% anterior and posterior delt, 70% subacromial space, 50% lateral delt  Vasopneumatic compression - R Shoulder, Low Pressure, 15', 38dg                          PT Long Term Goals - 12/11/14 1434    PT LONG TERM GOAL #1   Title R Shoulder AROM WFL and MMT 4+/5 or better by 12/25/14   Status Partially Met   PT LONG TERM GOAL #2   Title pt able to perform all ADLs, chores, and work duties without limitation by shoulder pain, weakness, or LOM by 12/25/14   Status On-going   PT LONG TERM GOAL #3   Title pt able to participate in recreational activities without limitation  by shoulder pain by 12/25/14   Status On-going               Plan - 12/15/14 1203    Clinical Impression Statement pt states she worked too hard over the weekend and noted increased shoulder pain (anterior).  Function and ROM still fine only noting mild catch with flexion AROM.  Today's treatment well tolerated.  Pt requested tape to shoulder due to past benefit despite mild skin irritation last week.  Skin was clear today so tape was applied.   PT Next Visit Plan standing AROM and light PRE progressions, continue RC and scap retraction resistance training; manual and modalities PRN   Consulted and Agree with Plan of Care Patient        Problem List Patient Active Problem List   Diagnosis Date Noted  . Right rotator cuff tear 10/03/2014    Kassie Keng PT, OCS 12/15/2014, 12:11 PM  [facility] [address]  [place], [place], [address] Phone: [phone]   Fax:  [phone]

## 2014-12-17 ENCOUNTER — Ambulatory Visit: Payer: BLUE CROSS/BLUE SHIELD | Admitting: Physical Therapy

## 2014-12-23 ENCOUNTER — Ambulatory Visit: Payer: BLUE CROSS/BLUE SHIELD | Admitting: Rehabilitation

## 2014-12-23 DIAGNOSIS — M25511 Pain in right shoulder: Secondary | ICD-10-CM | POA: Diagnosis not present

## 2014-12-23 DIAGNOSIS — R29898 Other symptoms and signs involving the musculoskeletal system: Secondary | ICD-10-CM

## 2014-12-23 NOTE — Therapy (Signed)
[facility] [address]  [place], [place], [address] Phone: [phone]   Fax:  [phone]  Physical Therapy Treatment  Patient Details  Name: Sheri Evans MRN: [id number] Date of Birth: 10-08-1975 Referring Provider:  Marchia Bond, MD  Encounter Date: 12/23/2014      PT End of Session - 12/23/14 1400    Visit Number 10   Number of Visits 12   Date for PT Re-Evaluation 12/25/14   PT Start Time 1400   PT Stop Time 1451   PT Time Calculation (min) 51 min      Past Medical History  Diagnosis Date  . PONV (postoperative nausea and vomiting)   . Migraines   . Celiac disease   . 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 Bursitis of shoulder 09/2014    right  . Rotator cuff tear 09/2014    right shoulder  . Poison ivy 09/30/2014    posterior right knee    Past Surgical History  Procedure Laterality Date  . Cesarean section  1996, 2000, 05/07/2002    x 3  . Tonsillectomy    . Laparoscopy Right 01/23/2013    Procedure: LAPAROSCOPY OPERATIVE, Right Ovarian Cystectomy, Lysis of Adhesions;  Surgeon: Allena Katz, MD;  Location: [facility];  Service: Gynecology;  Laterality: Right;  . Abdominal hysterectomy  2013    partial  . Carpal tunnel release Right   . Cholecystectomy    . Endometrial ablation  04/30/2010  . Hysteroscopy w/d&c  12/22/2006  . Endometrial ablation w/ novasure  12/22/2006  . Shoulder arthroscopy with rotator cuff repair and subacromial decompression Right 10/03/2014    Procedure: RIGHT SHOULDER ARTHROSCOPY DEBRIDEMENT,ACROMIOPLASTY,DISTAL CLAVICLE [place] ;  Surgeon: Marchia Bond, MD;  Location: [facility];  Service: Orthopedics;  Laterality: Right;  . Shoulder arthroscopy with distal clavicle resection Right 10/03/2014    Procedure: SHOULDER ARTHROSCOPY WITH DISTAL CLAVICLE RESECTION;  Surgeon: Marchia Bond, MD;  Location: [facility];   Service: Orthopedics;  Laterality: Right;    There were no vitals filed for this visit.  Visit Diagnosis:  Pain in joint, shoulder region, right  Shoulder weakness      Subjective Assessment - 12/23/14 1401    Subjective Denies pain currently or at rest. Mainly notes pain when lifting something or moving something and then pain ia a 4-5/10.    Currently in Pain? No/denies      TODAY'S TREATMENT TherEx - UBE lvl 2.0 90"/90"  Manual - R Shoulder grade 2 and 3 AP and caudal glides (full ROM but notes catch with Flexion) STM to Rt pec  TherEx - L Side-Lying R ER 3# 20x L Side-Lying R ABD 3# 20x full ROM L Side-Lying R Horiz ABD 3# 20x Corner Pec Stretch 3x20" Standing IR towel Stretch 5x10" Low Row 35# 20x High Row 25# 20x Standing Y Red TB 15x Staggered Standing One Arm Row 15# 15x Staggered Standing Punch Diagonal 10# 15x (15# too heavy) Standing Back to Wall with Foam Roll along spine B Shoulder ABD 2# 15x, Flexion 2# 15x  4 strips kinesiotape to R shoulder (pt requested) - 30% anterior and posterior delt, 70% subacromial space, 50% lateral delt  Vasopneumatic compression - R Shoulder, Low Pressure, 10', 40dg       PT Long Term Goals - 12/11/14 1434    PT LONG TERM GOAL #1   Title R Shoulder AROM WFL and MMT 4+/5  or better by 12/25/14   Status Partially Met   PT LONG TERM GOAL #2   Title pt able to perform all ADLs, chores, and work duties without limitation by shoulder pain, weakness, or LOM by 12/25/14   Status On-going   PT LONG TERM GOAL #3   Title pt able to participate in recreational activities without limitation by shoulder pain by 12/25/14   Status On-going               Plan - 12/23/14 1435    Clinical Impression Statement Pt came in with decreased pain today and able to tolerate most exercises without pain. Noted slight pain with abduction movements but mainly noted muscle fatigue. Continued with tape today due to pt reporting good benefit and skin  looking normal without irritation.    PT Next Visit Plan standing AROM and light PRE progressions, continue RC and scap retraction resistance training; manual and modalities PRN   Consulted and Agree with Plan of Care Patient        Problem List Patient Active Problem List   Diagnosis Date Noted  . Right rotator cuff tear 10/03/2014    Barbette Hair, PTA 12/23/2014, 2:54 PM  [facility] [address]  [place], [place], [address] Phone: [phone]   Fax:  [phone]

## 2014-12-25 ENCOUNTER — Ambulatory Visit: Payer: BLUE CROSS/BLUE SHIELD | Admitting: Physical Therapy

## 2014-12-25 DIAGNOSIS — R29898 Other symptoms and signs involving the musculoskeletal system: Secondary | ICD-10-CM

## 2014-12-25 DIAGNOSIS — M25511 Pain in right shoulder: Secondary | ICD-10-CM | POA: Diagnosis not present

## 2014-12-25 DIAGNOSIS — M25611 Stiffness of right shoulder, not elsewhere classified: Secondary | ICD-10-CM

## 2014-12-25 NOTE — Therapy (Signed)
[facility] [address]  [place], [place], [address] Phone: [phone]   Fax:  [phone]  Physical Therapy Treatment  Patient Details  Name: Sheri Evans MRN: [id number] Date of Birth: 05-31-1975 Referring Provider:  Marchia Bond, MD  Encounter Date: 12/25/2014      PT End of Session - 12/25/14 1406    Visit Number 11   Number of Visits 12   Date for PT Re-Evaluation 12/25/14   PT Start Time 1324   PT Stop Time 1459   PT Time Calculation (min) 56 min      Past Medical History  Diagnosis Date  . PONV (postoperative nausea and vomiting)   . Migraines   . Celiac disease   . 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 Bursitis of shoulder 09/2014    right  . Rotator cuff tear 09/2014    right shoulder  . Poison ivy 09/30/2014    posterior right knee    Past Surgical History  Procedure Laterality Date  . Cesarean section  1996, 2000, 05/07/2002    x 3  . Tonsillectomy    . Laparoscopy Right 01/23/2013    Procedure: LAPAROSCOPY OPERATIVE, Right Ovarian Cystectomy, Lysis of Adhesions;  Surgeon: Allena Katz, MD;  Location: [facility];  Service: Gynecology;  Laterality: Right;  . Abdominal hysterectomy  2013    partial  . Carpal tunnel release Right   . Cholecystectomy    . Endometrial ablation  04/30/2010  . Hysteroscopy w/d&c  12/22/2006  . Endometrial ablation w/ novasure  12/22/2006  . Shoulder arthroscopy with rotator cuff repair and subacromial decompression Right 10/03/2014    Procedure: RIGHT SHOULDER ARTHROSCOPY DEBRIDEMENT,ACROMIOPLASTY,DISTAL CLAVICLE [place] ;  Surgeon: Marchia Bond, MD;  Location: [facility];  Service: Orthopedics;  Laterality: Right;  . Shoulder arthroscopy with distal clavicle resection Right 10/03/2014    Procedure: SHOULDER ARTHROSCOPY WITH DISTAL CLAVICLE RESECTION;  Surgeon: Marchia Bond, MD;  Location: [facility];   Service: Orthopedics;  Laterality: Right;    There were no vitals filed for this visit.  Visit Diagnosis:  Pain in joint, shoulder region, right  Shoulder weakness  Shoulder stiffness, right      Subjective Assessment - 12/25/14 1407    Subjective pain-free other than intermittent catches/twinges.  States is sleeping fine.  States is able to perform all day to day activities without limitation other than activities requiring reaching behind back.   Currently in Pain? No/denies            [facility] PT Assessment - 12/25/14 0001    AROM   Right Shoulder Flexion 158 Degrees  notes catch (2/10) in superior / posterior R shoulder   Right Shoulder ABduction 160 Degrees   Right Shoulder Internal Rotation 50 Degrees  reach to L5   Right Shoulder External Rotation 94 Degrees  Reach to T3   Strength   Right Shoulder Flexion 4+/5   Right Shoulder Extension 4+/5   Right Shoulder ABduction 4/5   Right Shoulder Internal Rotation 4/5   Right Shoulder External Rotation 4+/5        TODAY'S TREATMENT TherEx - UBE lvl 3.0 90"/90"  R Shoulder AROM and MMT assessment  Chest Press 10# 2x15 Seated UT stretch 2x20" (due to noting tightness/pulling during chest press) Standing B Shoulder ABD 2#, 2x15 B Shoulder Flexion with 5# held by both hands and gentle ABD isometric during motion to decrease "catch"  in R shoulder 2x15 TRX Push-up 15x TRX High Row 15x Corner Pec Stretch 2x20" Towel IR Stretch Throwing Diagonal Blue TB 20x Rebounder: chest toss/catch 2kg 20x, B OH toss/catch 2kg 20x, R IR toss 0 ABD 0.5kg 20x, R baseball style toss 0.5kg 15x L Side-Lying R IR behind back 2# 10x  Vasopneumatic compression - R Shoulder, Low Pressure, 10', 40dg                          PT Long Term Goals - 12/25/14 1408    PT LONG TERM GOAL #2   Title pt able to perform all ADLs, chores, and work duties without limitation by shoulder pain, weakness, or LOM by 12/25/14                Plan - 12/25/14 1527    Clinical Impression Statement progressed exercises and seemed well tolerated.  One visit remains on POC so will see how pt feels after today's treatment and discuss plan at next visit (pt aware of this).   PT Next Visit Plan assess for hold vs d/c vs renew   Consulted and Agree with Plan of Care Patient        Problem List Patient Active Problem List   Diagnosis Date Noted  . Right rotator cuff tear 10/03/2014    Midge Momon PT, OCS 12/25/2014, 3:32 PM  [facility] [address]  [place], [place], [address] Phone: [phone]   Fax:  [phone]

## 2014-12-30 ENCOUNTER — Ambulatory Visit: Payer: BLUE CROSS/BLUE SHIELD | Admitting: Physical Therapy

## 2014-12-30 DIAGNOSIS — R29898 Other symptoms and signs involving the musculoskeletal system: Secondary | ICD-10-CM

## 2014-12-30 DIAGNOSIS — M25511 Pain in right shoulder: Secondary | ICD-10-CM | POA: Diagnosis not present

## 2014-12-30 NOTE — Therapy (Addendum)
[facility] [address]  [place], [place], [address] Phone: [phone]   Fax:  [phone]  Physical Therapy Treatment  Patient Details  Name: Sheri Evans MRN: [id number] Date of Birth: 13-Dec-1975 Referring Sheronda Parran:  Marchia Bond, MD  Encounter Date: 12/30/2014      PT End of Session - 12/30/14 1120    Visit Number 12   Number of Visits 12   PT Start Time 0814   PT Stop Time 1203   PT Time Calculation (min) 47 min      Past Medical History  Diagnosis Date  . PONV (postoperative nausea and vomiting)   . Migraines   . Celiac disease   . 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 Bursitis of shoulder 09/2014    right  . Rotator cuff tear 09/2014    right shoulder  . Poison ivy 09/30/2014    posterior right knee    Past Surgical History  Procedure Laterality Date  . Cesarean section  1996, 2000, 05/07/2002    x 3  . Tonsillectomy    . Laparoscopy Right 01/23/2013    Procedure: LAPAROSCOPY OPERATIVE, Right Ovarian Cystectomy, Lysis of Adhesions;  Surgeon: Allena Katz, MD;  Location: [facility];  Service: Gynecology;  Laterality: Right;  . Abdominal hysterectomy  2013    partial  . Carpal tunnel release Right   . Cholecystectomy    . Endometrial ablation  04/30/2010  . Hysteroscopy w/d&c  12/22/2006  . Endometrial ablation w/ novasure  12/22/2006  . Shoulder arthroscopy with rotator cuff repair and subacromial decompression Right 10/03/2014    Procedure: RIGHT SHOULDER ARTHROSCOPY DEBRIDEMENT,ACROMIOPLASTY,DISTAL CLAVICLE [place] ;  Surgeon: Marchia Bond, MD;  Location: [facility];  Service: Orthopedics;  Laterality: Right;  . Shoulder arthroscopy with distal clavicle resection Right 10/03/2014    Procedure: SHOULDER ARTHROSCOPY WITH DISTAL CLAVICLE RESECTION;  Surgeon: Marchia Bond, MD;  Location: [facility];  Service: Orthopedics;  Laterality:  Right;    There were no vitals filed for this visit.  Visit Diagnosis:  Pain in joint, shoulder region, right  Shoulder weakness      Subjective Assessment - 12/30/14 1117    Subjective States was very busy at work this weekend and is very sore all over today.  She states R Shoulder pain is 3-4/10 today throughout entire shoulder.  States rolling over onto R side would wake her due to pain and spasms into R UE.   Currently in Pain? Yes   Pain Score 4    Pain Location Shoulder   Pain Orientation Right            [facility] PT Assessment - 12/30/14 0001    AROM   Right Shoulder Flexion 165 Degrees   Right Shoulder ABduction 160 Degrees   Right Shoulder Internal Rotation 56 Degrees   Right Shoulder External Rotation 95 Degrees  mild click and pain   PROM   Right Shoulder Flexion 170 Degrees   Right Shoulder ABduction 172 Degrees   Right Shoulder Internal Rotation 65 Degrees  mild anterior pain/pull   Right Shoulder External Rotation 100 Degrees   Strength   Right Shoulder Flexion 4+/5   Right Shoulder Extension 4+/5   Right Shoulder ABduction 4+/5   Right Shoulder Internal Rotation 4+/5   Right Shoulder External Rotation 4+/5  slight clicking sensation and pain noted        TODAY'S TREATMENT R Shoulder  PROM, AROM, and MMT assessment TherEx - Standing R Shoulder Flexion, ABD, Scaption diagonal and W all with Yellow TB 15x each (all added to HEP) Manual - Grade 2 AP and Caudal glides R shoulder 90 ABD due to soreness today related to work Vasopneumatic compression - 40dg, Low Pressure, 15' to R shoulder due to soreness today                     PT Education - 12/30/14 1159    Education provided Yes   Education Details HEP Update   Person(s) Educated Patient   Methods Explanation;Demonstration;Handout   Comprehension Returned demonstration;Verbalized understanding             PT Long Term Goals - 12/30/14 1356    PT LONG TERM GOAL #1   Title  R Shoulder AROM WFL and MMT 4+/5 or better by 12/25/14   Status Achieved   PT LONG TERM GOAL #2   Title pt able to perform all ADLs, chores, and work duties without limitation by shoulder pain, weakness, or LOM by 12/25/14  is able to perform all activities but does note increased pain if performed day after day as she has done in the past week   Status Partially Met   PT LONG TERM GOAL #3   Title pt able to participate in recreational activities without limitation by shoulder pain by 12/25/14  typically no limitations, but increased soreness today   Status Partially Met               Plan - 12/30/14 1159    Clinical Impression Statement Pt's shoulder PROM and AROM have improved to normal all planes and MMT with mild limitations when compared to L but still quite strong (4+/5 grossly).  There was a mildly painful clicking sensation noted in R shoulder with ABD and ER AROM and MMT today.  In addition, pt with c/o shoulder soreness today due to working a great deal at [employer] over the past week.  She states level of soreness has interrupted her sleep lately when she attempts to lie on R side.  Her current pain seems general soft tissue pain related to overuse over the past few days.  Pt independent with HEP.  Due to good progress and no functional limitations pt is being placed on hold from PT at this time pending her MD appointment next week.  I advised her if active rest does not decrease shoulder pain back to prior level she can come back in for manual and modalities to assist with pain reduction, otherwise pt will only return to PT if MD desires following appointment next week.   PT Next Visit Plan pt on hold from PT pending MD appointment next week.   Consulted and Agree with Plan of Care Patient        Problem List Patient Active Problem List   Diagnosis Date Noted  . Right rotator cuff tear 10/03/2014    HALL,RALPH PT, OCS 12/30/2014, 2:02 PM  [facility] [address]  [place], [place], [address] Phone: [phone]   Fax:  [phone]     PHYSICAL THERAPY DISCHARGE SUMMARY  Visits from Start of Care: 12  Current functional level related to goals / functional outcomes: Goals partially met or met and pt is independent with continued progressions with HEP.   Remaining deficits: Intermittent shoulder pain   Education / Equipment: HEP Plan: Patient agrees to discharge.  Patient goals were partially met. Patient is being discharged due to being pleased with the current functional level.  ?????        Leonette Most PT, OCS 03/26/2015  1:37 PM

## 2015-04-29 ENCOUNTER — Encounter (HOSPITAL_BASED_OUTPATIENT_CLINIC_OR_DEPARTMENT_OTHER): Payer: Self-pay

## 2015-04-29 ENCOUNTER — Emergency Department (HOSPITAL_BASED_OUTPATIENT_CLINIC_OR_DEPARTMENT_OTHER)
Admission: EM | Admit: 2015-04-29 | Discharge: 2015-04-29 | Disposition: A | Discharge status: Home / Self Care | Payer: BLUE CROSS/BLUE SHIELD | Attending: Emergency Medicine | Admitting: Emergency Medicine

## 2015-04-29 DIAGNOSIS — Z87828 Personal history of other (healed) physical injury and trauma: Secondary | ICD-10-CM | POA: Insufficient documentation

## 2015-04-29 DIAGNOSIS — R42 Dizziness and giddiness: Secondary | ICD-10-CM | POA: Insufficient documentation

## 2015-04-29 DIAGNOSIS — M19011 Primary osteoarthritis, right shoulder: Secondary | ICD-10-CM | POA: Diagnosis not present

## 2015-04-29 DIAGNOSIS — E220 Acromegaly and pituitary gigantism: Secondary | ICD-10-CM | POA: Insufficient documentation

## 2015-04-29 DIAGNOSIS — L500 Allergic urticaria: Secondary | ICD-10-CM | POA: Diagnosis not present

## 2015-04-29 DIAGNOSIS — Z79899 Other long term (current) drug therapy: Secondary | ICD-10-CM | POA: Insufficient documentation

## 2015-04-29 DIAGNOSIS — Z880 Allergy status to penicillin: Secondary | ICD-10-CM | POA: Diagnosis not present

## 2015-04-29 DIAGNOSIS — Z8679 Personal history of other diseases of the circulatory system: Secondary | ICD-10-CM | POA: Diagnosis not present

## 2015-04-29 DIAGNOSIS — T7840XA Allergy, unspecified, initial encounter: Secondary | ICD-10-CM | POA: Insufficient documentation

## 2015-04-29 DIAGNOSIS — R0789 Other chest pain: Secondary | ICD-10-CM | POA: Insufficient documentation

## 2015-04-29 DIAGNOSIS — R202 Paresthesia of skin: Secondary | ICD-10-CM | POA: Insufficient documentation

## 2015-04-29 DIAGNOSIS — R062 Wheezing: Secondary | ICD-10-CM | POA: Insufficient documentation

## 2015-04-29 DIAGNOSIS — Z8719 Personal history of other diseases of the digestive system: Secondary | ICD-10-CM | POA: Insufficient documentation

## 2015-04-29 MED ORDER — DIPHENHYDRAMINE HCL 50 MG/ML IJ SOLN
25.0000 mg | Freq: Once | INTRAMUSCULAR | Status: AC
  Administered 2015-04-29: 25 mg via INTRAVENOUS
  Filled 2015-04-29: qty 1

## 2015-04-29 MED ORDER — FAMOTIDINE 20 MG PO TABS: 20.0000 mg | ORAL_TABLET | Freq: Two times a day (BID) | ORAL | Status: DC

## 2015-04-29 MED ORDER — METHYLPREDNISOLONE SODIUM SUCC 125 MG IJ SOLR
125.0000 mg | Freq: Once | INTRAMUSCULAR | Status: AC
  Administered 2015-04-29: 125 mg via INTRAVENOUS
  Filled 2015-04-29: qty 2

## 2015-04-29 MED ORDER — PREDNISONE 20 MG PO TABS: 60.0000 mg | ORAL_TABLET | Freq: Every day | ORAL | Status: DC

## 2015-04-29 MED ORDER — SODIUM CHLORIDE 0.9 % IV BOLUS (SEPSIS)
1000.0000 mL | Freq: Once | INTRAVENOUS | Status: AC
  Administered 2015-04-29: 1000 mL via INTRAVENOUS

## 2015-04-29 MED ORDER — FAMOTIDINE IN NACL 20-0.9 MG/50ML-% IV SOLN
INTRAVENOUS | Status: AC
  Filled 2015-04-29: qty 50

## 2015-04-29 MED ORDER — FAMOTIDINE IN NACL 20-0.9 MG/50ML-% IV SOLN
20.0000 mg | Freq: Once | INTRAVENOUS | Status: AC
  Administered 2015-04-29: 20 mg via INTRAVENOUS
  Filled 2015-04-29: qty 50

## 2015-04-29 MED ORDER — EPINEPHRINE 0.3 MG/0.3ML IJ SOAJ: 0.3000 mg | Freq: Once | INTRAMUSCULAR | Status: AC

## 2015-04-29 NOTE — ED Provider Notes (Signed)
TIME SEEN: 1:35 AM  CHIEF COMPLAINT: Hives, facial swelling, mouth tingling, wheezing, lightheadedness  HPI: Pt is a 40 y.o. female with history of migraines, celiac disease who presents to the emergency department with diffuse scattered hives, bilateral cheek swelling, mouth and tongue tingling, wheezing, chest tightness and lightheadedness that started approximately 1-1/2 hours ago. Patient reports that she did eat sherbet and grits tonight around 7 PM and then was cutting pineapple, Kiwi and watermelon for her children around 9:30 PM. She states around midnight she began feeling "funny". There is that she was having diffuse hives and felt like her mouth felt "funny". States that she took 75 mg of oral Benadryl at home. Hives have slightly improved but she now feels that she has bilateral cheek swelling, worse on the right side. She did have some wheezing at home but states that is now gone. She is feeling lightheaded or having chest tightness. No shortness of breath. No vomiting or diarrhea. No fever. No other rash. No other new exposures. States she has had tingling in her lips and tongue when she is eating avocado and mango before but never had a reaction like this. She has had shrimp, pineapple, Kiwi and watermelon before without any difficulty. No new medications. No new soaps, lotions, detergents.  ROS: See HPI Constitutional: no fever  Eyes: no drainage  ENT: no runny nose   Cardiovascular:  no chest pain  Resp: no SOB  GI: no vomiting GU: no dysuria Integumentary: no rash  Allergy:  hives  Musculoskeletal: no leg swelling  Neurological: no slurred speech ROS otherwise negative  PAST MEDICAL HISTORY/PAST SURGICAL HISTORY:  Past Medical History  Diagnosis Date  . PONV (postoperative nausea and vomiting)   . Migraines   . Celiac disease   . 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 Bursitis of shoulder 09/2014    right  . Rotator cuff tear 09/2014    right shoulder  . Poison ivy  09/30/2014    posterior right knee    MEDICATIONS:  Prior to Admission medications   Medication Sig Start Date End Date Taking? Authorizing Provider  HYDROcodone-acetaminophen (NORCO/VICODIN) 5-325 MG per tablet Take 1 tablet by mouth every 6 (six) hours as needed for moderate pain.    Historical Provider, MD  ibuprofen (ADVIL,MOTRIN) 200 MG tablet Take 800 mg by mouth every 6 (six) hours as needed.    Historical Provider, MD  methocarbamol (ROBAXIN) 500 MG tablet Take 1 tablet (500 mg total) by mouth 4 (four) times daily. 10/03/14   Marchia Bond, MD  Multiple Vitamin (MULTIVITAMIN WITH MINERALS) TABS tablet Take 1 tablet by mouth daily.    Historical Provider, MD  ondansetron (ZOFRAN) 4 MG tablet Take 1 tablet (4 mg total) by mouth every 8 (eight) hours as needed for nausea or vomiting. Patient not taking: Reported on 11/13/2014 10/03/14   Marchia Bond, MD  oxyCODONE-acetaminophen (PERCOCET) 10-325 MG per tablet Take 1-2 tablets by mouth every 6 (six) hours as needed for pain. 10/03/14   Marchia Bond, MD  senna (SENOKOT) 8.6 MG TABS tablet Take 1 tablet (8.6 mg total) by mouth at bedtime as needed for mild constipation. Patient not taking: Reported on 11/13/2014 10/03/14   Marchia Bond, MD    ALLERGIES:  Allergies  Allergen Reactions  . Penicillins Anaphylaxis  . Dilaudid [Hydromorphone Hcl] Nausea Only and Other (See Comments)    CAUSES SEVERE ANGER  . Wheat Bran Other (See Comments)    CELIAC DISEASE    SOCIAL HISTORY:  Social History  Substance Use Topics  . Smoking status: Never Smoker   . Smokeless tobacco: Never Used  . Alcohol Use: Yes     Comment: occasionally    FAMILY HISTORY: No family history on file.  EXAM: BP 189/93 mmHg  Pulse 75  Temp(Src) 98.2 F (36.8 C) (Oral)  Resp 20  Ht 5' 9"  (1.753 m)  Wt 250 lb (113.399 kg)  BMI 36.90 kg/m2  SpO2 100%  LMP 05/01/2011 CONSTITUTIONAL: Alert and oriented and responds appropriately to questions. Well-appearing;  well-nourished HEAD: Normocephalic EYES: Conjunctivae clear, PERRL ENT: normal nose; no rhinorrhea; moist mucous membranes; pharynx without lesions noted; no angioedema, patient does appear to have some swelling of her bilateral cheeks without erythema or warmth, no tenderness over the face, no trismus or drooling, no stridor, normal phonation, no dental abscess or dental caries, no Ludwig's angina NECK: Supple, no meningismus, no LAD  CARD: RRR; S1 and S2 appreciated; no murmurs, no clicks, no rubs, no gallops RESP: Normal chest excursion without splinting or tachypnea; breath sounds clear and equal bilaterally; no wheezes, no rhonchi, no rales, no hypoxia or respiratory distress, speaking full sentences ABD/GI: Normal bowel sounds; non-distended; soft, non-tender, no rebound, no guarding, no peritoneal signs BACK:  The back appears normal and is non-tender to palpation, there is no CVA tenderness EXT: Normal ROM in all joints; non-tender to palpation; no edema; normal capillary refill; no cyanosis, no calf tenderness or swelling    SKIN: Normal color for age and race; warm, very mild diffuse scattered urticaria to her torso, face appears flushed, no rash on her palms or soles, no petechia or purpura, no desquamation or blisters, no bull's-eye rash NEURO: Moves all extremities equally, sensation to light touch intact diffusely, cranial nerves II through XII intact PSYCH: The patient's mood and manner are appropriate. Grooming and personal hygiene are appropriate.  MEDICAL DECISION MAKING: Patient here that appears to be an allergic reaction. It appears to be mild to moderate in nature. It appears it is improving since oral Benadryl as well hasn't gotten better and she is also wheezing. She is not hypertensive. She is hemodynamically stable. Given she is having chest tightness or to the screening EKG although I suspect this is in the setting of an allergic reaction. We'll give her more IV Benadryl, IV  Pepcid, IV Solu-Medrol IV fluids. We'll keep her on the cardiac monitor. At this time even though she feels multiple systems are involved her exam is relatively benign and I do not feel epinephrine is indicated but if she begins to worsen at all she will need a dose of IM epinephrine. She agrees with this plan.  ED PROGRESS: 2:45 AM  Pt's hives are now completely gone. Cheek swelling appears to have improved as well. Still has normal phonation, clear lungs. Vital signs still stable. Chest tightness is gone. I then she is safe to be discharged. Advised her to use Benadryl 50 mg every 8 hours for the next 2 days and then as needed. Will discharge on prednisone burst and Pepcid. We'll also give her an epinephrine pen. Discussed with her that she will need to follow up with her PCP to be referred to an allergist. I advised her to stay away from shellfish, pineapple, watermelon and kiwi at this time as her next reaction may be worse than the one today. Discussed return precautions. She verbalized understanding and is comfortable with this plan.    Date: 04/29/2015 1:50 AM  Rate: 59  Rhythm:  normal sinus rhythm  QRS Axis: normal  Intervals: normal  ST/T Wave abnormalities: normal  Conduction Disutrbances: RBBB  Narrative Interpretation: RBBB; no old for comparison           [place], DO 04/29/15 [phone]

## 2015-04-29 NOTE — Discharge Instructions (Signed)
You should take Benadryl 50 mg every 8 hours for the next 2 days and then after that you should take it every 8 hours just as needed.   Allergies An allergy is an abnormal reaction to a substance by the body's defense system (immune system). Allergies can develop at any age. WHAT CAUSES ALLERGIES? An allergic reaction happens when the immune system mistakenly reacts to a normally harmless substance, called an allergen, as if it were harmful. The immune system releases antibodies to fight the substance. Antibodies eventually release a chemical called histamine into the bloodstream. The release of histamine is meant to protect the body from infection, but it also causes discomfort. An allergic reaction can be triggered by:  Eating an allergen.  Inhaling an allergen.  Touching an allergen. WHAT TYPES OF ALLERGIES ARE THERE? There are many types of allergies. Common types include:  Seasonal allergies. People with this type of allergy are usually allergic to substances that are only present during certain seasons, such as molds and pollens.  Food allergies.  Drug allergies.  Insect allergies.  Animal dander allergies. WHAT ARE SYMPTOMS OF ALLERGIES? Possible allergy symptoms include:  Swelling of the lips, face, tongue, mouth, or throat.  Sneezing, coughing, or wheezing.  Nasal congestion.  Tingling in the mouth.  Rash.  Itching.  Itchy, red, swollen areas of skin (hives).  Watery eyes.  Vomiting.  Diarrhea.  Dizziness.  Lightheadedness.  Fainting.  Trouble breathing or swallowing.  Chest tightness.  Rapid heartbeat. HOW ARE ALLERGIES DIAGNOSED? Allergies are diagnosed with a medical and family history and one or more of the following:  Skin tests.  Blood tests.  A food diary. A food diary is a record of all the foods and drinks you have in a day and of all the symptoms you experience.  The results of an elimination diet. An elimination diet involves  eliminating foods from your diet and then adding them back in one by one to find out if a certain food causes an allergic reaction. HOW ARE ALLERGIES TREATED? There is no cure for allergies, but allergic reactions can be treated with medicine. Severe reactions usually need to be treated at a hospital. HOW CAN REACTIONS BE PREVENTED? The best way to prevent an allergic reaction is by avoiding the substance you are allergic to. Allergy shots and medicines can also help prevent reactions in some cases. People with severe allergic reactions may be able to prevent a life-threatening reaction called anaphylaxis with a medicine given right after exposure to the allergen.   This information is not intended to replace advice given to you by your health care provider. Make sure you discuss any questions you have with your health care provider.   Document Released: 06/14/2002 Document Revised: 04/11/2014 Document Reviewed: 12/31/2013 Elsevier Interactive Patient Education 2016 Else[employer] Inc.   Epinephrine Injection Epinephrine is a medicine given by injection to temporarily treat an emergency allergic reaction. It is also used to treat severe asthmatic attacks and other lung problems. The medicine helps to enlarge (dilate) the small breathing tubes of the lungs. A life-threatening, sudden allergic reaction that involves the whole body is called anaphylaxis. Because of potential side effects, epinephrine should only be used as directed by your caregiver. RISKS AND COMPLICATIONS Possible side effects of epinephrine injections include:  Chest pain.  Irregular or rapid heartbeat.  Shortness of breath.  Nausea.  Vomiting.  Abdominal pain or cramping.  Sweating.  Dizziness.  Weakness.  Headache.  Nervousness. Report all side effects  to your caregiver. HOW TO GIVE AN EPINEPHRINE INJECTION Give the epinephrine injection immediately when symptoms of a severe reaction begin. Inject the medicine  into the outer thigh or any available, large muscle. Your caregiver can teach you how to do this. You do not need to remove any clothing. After the injection, call your local emergency services (911 in [place] Even if you improve after the injection, you need to be examined at a hospital emergency department. Epinephrine works quickly, but it also wears off quickly. Delayed reactions can occur. A delayed reaction may be as serious and dangerous as the initial reaction. HOME CARE INSTRUCTIONS  Make sure you and your family know how to give an epinephrine injection.  Use epinephrine injections as directed by your caregiver. Do not use this medicine more often or in larger doses than prescribed.  Always carry your epinephrine injection or anaphylaxis kit with you. This can be lifesaving if you have a severe reaction.  Store the medicine in a cool, dry place. If the medicine becomes discolored or cloudy, dispose of it properly and replace it with new medicine.  Check the expiration date on your medicine. It may be unsafe to use medicines past their expiration date.  Tell your caregiver about any other medicines you are taking. Some medicines can react badly with epinephrine.  Tell your caregiver about any medical conditions you have, such as diabetes, high blood pressure (hypertension), heart disease, irregular heartbeats, or if you are pregnant. SEEK IMMEDIATE MEDICAL CARE IF:  You have used an epinephrine injection. Call your local emergency services (911 in [place] Even if you improve after the injection, you need to be examined at a hospital emergency department to make sure your allergic reaction is under control. You will also be monitored for adverse effects from the medicine.  You have chest pain.  You have irregular or fast heartbeats.  You have shortness of breath.  You have severe headaches.  You have severe nausea, vomiting, or abdominal cramps.  You have severe pain, swelling, or  redness in the area where you gave the injection.   This information is not intended to replace advice given to you by your health care provider. Make sure you discuss any questions you have with your health care provider.   Document Released: 03/18/2000 Document Revised: 06/13/2011 Document Reviewed: 10/08/2014 Elsevier Interactive Patient Education [employer].

## 2015-04-29 NOTE — ED Notes (Signed)
Pt c/o hives since midnight on bilateral upper arms, neck and chest, states she is having mouth swelling also.  Does not know what she came into contact with, took 63m benadryl since midnight and an allegra without relief.

## 2016-07-11 ENCOUNTER — Other Ambulatory Visit: Payer: Self-pay | Admitting: Orthopedic Surgery

## 2016-07-11 DIAGNOSIS — M25511 Pain in right shoulder: Secondary | ICD-10-CM

## 2016-07-26 ENCOUNTER — Ambulatory Visit
Admission: RE | Admit: 2016-07-26 | Discharge: 2016-07-26 | Disposition: A | Discharge status: Home / Self Care | Payer: BLUE CROSS/BLUE SHIELD | Source: Ambulatory Visit | Attending: Orthopedic Surgery | Admitting: Orthopedic Surgery

## 2016-07-26 DIAGNOSIS — M25511 Pain in right shoulder: Secondary | ICD-10-CM

## 2016-07-26 MED ORDER — IOPAMIDOL (ISOVUE-M 200) INJECTION 41%
15.0000 mL | Freq: Once | INTRAMUSCULAR | Status: AC
  Administered 2016-07-26: 15 mL via INTRA_ARTICULAR

## 2016-12-19 ENCOUNTER — Other Ambulatory Visit (HOSPITAL_BASED_OUTPATIENT_CLINIC_OR_DEPARTMENT_OTHER): Payer: Self-pay | Admitting: Nurse Practitioner

## 2016-12-19 ENCOUNTER — Ambulatory Visit (HOSPITAL_BASED_OUTPATIENT_CLINIC_OR_DEPARTMENT_OTHER)
Admission: RE | Admit: 2016-12-19 | Discharge: 2016-12-19 | Disposition: A | Discharge status: Home / Self Care | Payer: BLUE CROSS/BLUE SHIELD | Source: Ambulatory Visit | Attending: Nurse Practitioner | Admitting: Nurse Practitioner

## 2016-12-19 DIAGNOSIS — R600 Localized edema: Secondary | ICD-10-CM

## 2016-12-19 DIAGNOSIS — M79604 Pain in right leg: Secondary | ICD-10-CM

## 2017-06-08 ENCOUNTER — Other Ambulatory Visit: Payer: Self-pay

## 2017-06-08 ENCOUNTER — Emergency Department (HOSPITAL_BASED_OUTPATIENT_CLINIC_OR_DEPARTMENT_OTHER): Payer: BLUE CROSS/BLUE SHIELD

## 2017-06-08 ENCOUNTER — Emergency Department (HOSPITAL_BASED_OUTPATIENT_CLINIC_OR_DEPARTMENT_OTHER)
Admission: EM | Admit: 2017-06-08 | Discharge: 2017-06-09 | Disposition: A | Discharge status: Home / Self Care | Payer: BLUE CROSS/BLUE SHIELD | Attending: Emergency Medicine | Admitting: Emergency Medicine

## 2017-06-08 ENCOUNTER — Encounter (HOSPITAL_BASED_OUTPATIENT_CLINIC_OR_DEPARTMENT_OTHER): Payer: Self-pay | Admitting: *Deleted

## 2017-06-08 DIAGNOSIS — R1031 Right lower quadrant pain: Secondary | ICD-10-CM | POA: Insufficient documentation

## 2017-06-08 DIAGNOSIS — K900 Celiac disease: Secondary | ICD-10-CM | POA: Insufficient documentation

## 2017-06-08 DIAGNOSIS — R1033 Periumbilical pain: Secondary | ICD-10-CM | POA: Diagnosis present

## 2017-06-08 LAB — CBC WITH DIFFERENTIAL/PLATELET
Basophils Absolute: 0 10*3/uL (ref 0.0–0.1)
Basophils Relative: 0 %
EOS ABS: 0.1 10*3/uL (ref 0.0–0.7)
EOS PCT: 1 %
HCT: 42.6 % (ref 36.0–46.0)
Hemoglobin: 14.7 g/dL (ref 12.0–15.0)
LYMPHS ABS: 2.2 10*3/uL (ref 0.7–4.0)
Lymphocytes Relative: 25 %
MCH: 30.9 pg (ref 26.0–34.0)
MCHC: 34.5 g/dL (ref 30.0–36.0)
MCV: 89.5 fL (ref 78.0–100.0)
MONO ABS: 0.7 10*3/uL (ref 0.1–1.0)
MONOS PCT: 8 %
Neutro Abs: 5.7 10*3/uL (ref 1.7–7.7)
Neutrophils Relative %: 66 %
PLATELETS: 240 10*3/uL (ref 150–400)
RBC: 4.76 MIL/uL (ref 3.87–5.11)
RDW: 12.9 % (ref 11.5–15.5)
WBC: 8.7 10*3/uL (ref 4.0–10.5)

## 2017-06-08 LAB — COMPREHENSIVE METABOLIC PANEL
ALT: 34 U/L (ref 14–54)
ANION GAP: 6 (ref 5–15)
AST: 31 U/L (ref 15–41)
Albumin: 4.5 g/dL (ref 3.5–5.0)
Alkaline Phosphatase: 49 U/L (ref 38–126)
BUN: 11 mg/dL (ref 6–20)
CO2: 26 mmol/L (ref 22–32)
Calcium: 8.7 mg/dL — ABNORMAL LOW (ref 8.9–10.3)
Chloride: 101 mmol/L (ref 101–111)
Creatinine, Ser: 0.9 mg/dL (ref 0.44–1.00)
GFR calc Af Amer: >60 mL/min (ref >60)
GFR calc non Af Amer: >60 mL/min (ref >60)
Glucose, Bld: 107 mg/dL — ABNORMAL HIGH (ref 65–99)
POTASSIUM: 4 mmol/L (ref 3.5–5.1)
Sodium: 133 mmol/L — ABNORMAL LOW (ref 135–145)
Total Bilirubin: 0.7 mg/dL (ref 0.3–1.2)
Total Protein: 7.3 g/dL (ref 6.5–8.1)

## 2017-06-08 LAB — URINALYSIS, ROUTINE W REFLEX MICROSCOPIC
Bilirubin Urine: NEGATIVE
Glucose, UA: NEGATIVE mg/dL
HGB URINE DIPSTICK: NEGATIVE
Ketones, ur: NEGATIVE mg/dL
Leukocytes, UA: NEGATIVE
NITRITE: NEGATIVE
Protein, ur: NEGATIVE mg/dL
pH: 6.5 (ref 5.0–8.0)

## 2017-06-08 LAB — WET PREP, GENITAL
SPERM: NONE SEEN
TRICH WET PREP: NONE SEEN
Yeast Wet Prep HPF POC: NONE SEEN

## 2017-06-08 LAB — LIPASE, BLOOD: Lipase: 24 U/L (ref 11–51)

## 2017-06-08 MED ORDER — MORPHINE SULFATE (PF) 4 MG/ML IV SOLN
4.0000 mg | Freq: Once | INTRAVENOUS | Status: AC
  Administered 2017-06-08: 4 mg via INTRAVENOUS
  Filled 2017-06-08: qty 1

## 2017-06-08 MED ORDER — ONDANSETRON HCL 4 MG/2ML IJ SOLN
4.0000 mg | Freq: Once | INTRAMUSCULAR | Status: AC
  Administered 2017-06-08: 4 mg via INTRAVENOUS
  Filled 2017-06-08: qty 2

## 2017-06-08 MED ORDER — IOPAMIDOL (ISOVUE-300) INJECTION 61%
100.0000 mL | Freq: Once | INTRAVENOUS | Status: AC | PRN
  Administered 2017-06-08: 100 mL via INTRAVENOUS

## 2017-06-08 MED ORDER — MORPHINE SULFATE (PF) 4 MG/ML IV SOLN
6.0000 mg | Freq: Once | INTRAVENOUS | Status: AC
  Administered 2017-06-08: 6 mg via INTRAVENOUS
  Filled 2017-06-08: qty 2

## 2017-06-08 NOTE — ED Notes (Signed)
Pt. Reports to RN she is still having pain in her abd.  Pt. Walked to restroom with no difficulty.

## 2017-06-08 NOTE — ED Triage Notes (Signed)
Abdominal pain x 3 days. Stabbing pain in her right lower quad. Hx of ovarian cyst.

## 2017-06-08 NOTE — ED Provider Notes (Signed)
[facility] EMERGENCY DEPARTMENT Provider Note   CSN: [id number] Arrival date & time: 06/08/17  1824     History   Chief Complaint Chief Complaint  Patient presents with  . Abdominal Pain    HPI Sheri Evans is a 42 y.o. female.  HPI   42 year old female with history of celiac disease, abdominal hysterectomy presenting with abdominal pain.  Patient report for the past 3 days she has had pain to her mid to lower back that is waxing waning however today her pain is involving her periumbilical region and lower abdomen.  Pain is described as a stabbing sensation, worsening with movement, 10 out of 10, with associated nausea but no vomiting.  She tries having a bowel movement, able to pass flatus minimal stool production.  She endorsed subjective fever.  She denies headache, chest pain, trouble breathing, productive cough, dysuria, hematuria, vaginal bleeding or vaginal discharge.  She mentioned her pain felt similar to prior ovarian cyst pain.  She has had a partial hysterectomy but still has intact ovary to the right lower quadrant.  She has been taking Advil without adequate relief.  She denies any recent injury or strenuous activity.  No prior history of kidney stone.  Past Medical History:  Diagnosis Date  . Bursitis of shoulder 09/2014   right  . Celiac disease   . Migraines   . 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 Poison ivy 09/30/2014   posterior right knee  . PONV (postoperative nausea and vomiting)   . Rotator cuff tear 09/2014   right shoulder    Patient Active Problem List   Diagnosis Date Noted  . Right rotator cuff tear 10/03/2014    Past Surgical History:  Procedure Laterality Date  . ABDOMINAL HYSTERECTOMY  2013   partial  . CARPAL TUNNEL RELEASE Right   . [place], 2000, 05/07/2002   x 3  . CHOLECYSTECTOMY    . ENDOMETRIAL ABLATION  04/30/2010  . ENDOMETRIAL ABLATION W/ NOVASURE  12/22/2006  . HYSTEROSCOPY W/D&C  12/22/2006    . LAPAROSCOPY Right 01/23/2013   Procedure: LAPAROSCOPY OPERATIVE, Right Ovarian Cystectomy, Lysis of Adhesions;  Surgeon: Allena Katz, MD;  Location: [facility];  Service: Gynecology;  Laterality: Right;  . SHOULDER ARTHROSCOPY WITH DISTAL CLAVICLE RESECTION Right 10/03/2014   Procedure: SHOULDER ARTHROSCOPY WITH DISTAL CLAVICLE RESECTION;  Surgeon: Marchia Bond, MD;  Location: [facility];  Service: Orthopedics;  Laterality: Right;  . SHOULDER ARTHROSCOPY WITH ROTATOR CUFF REPAIR AND SUBACROMIAL DECOMPRESSION Right 10/03/2014   Procedure: RIGHT SHOULDER ARTHROSCOPY DEBRIDEMENT,ACROMIOPLASTY,DISTAL CLAVICLE EXCISION,ROTATOR CUFF REPAIR ;  Surgeon: Marchia Bond, MD;  Location: [facility];  Service: Orthopedics;  Laterality: Right;  . TONSILLECTOMY      OB History    Gravida Para Term Preterm AB Living   3 3 3  0 0 3   SAB TAB Ectopic Multiple Live Births   0 0 0 0         Home Medications    Prior to Admission medications   Medication Sig Start Date End Date Taking? Authorizing Provider  EPINEPHrine (ADRENACLICK) 0.3 ZH/2.9 mL IJ SOAJ injection Inject 0.3 mLs (0.3 mg total) into the muscle once. 04/29/15   Ward, Delice Bison, DO  famotidine (PEPCID) 20 MG tablet Take 1 tablet (20 mg total) by mouth 2 (two) times daily. 04/29/15   Ward, Delice Bison, DO  HYDROcodone-acetaminophen (NORCO/VICODIN) 5-325 MG per tablet Take 1 tablet by mouth every 6 (  six) hours as needed for moderate pain.    [provider]  ibuprofen (ADVIL,MOTRIN) 200 MG tablet Take 800 mg by mouth every 6 (six) hours as needed.    [provider]  methocarbamol (ROBAXIN) 500 MG tablet Take 1 tablet (500 mg total) by mouth 4 (four) times daily. 10/03/14   Marchia Bond, MD  Multiple Vitamin (MULTIVITAMIN WITH MINERALS) TABS tablet Take 1 tablet by mouth daily.    [provider]  oxyCODONE-acetaminophen (PERCOCET) 10-325 MG per tablet Take 1-2 tablets by mouth every 6 (six)  hours as needed for pain. 10/03/14   Marchia Bond, MD  predniSONE (DELTASONE) 20 MG tablet Take 3 tablets (60 mg total) by mouth daily. 04/29/15   Ward, Delice Bison, DO    Family History No family history on file.  Social History Social History   Tobacco Use  . Smoking status: Never Smoker  . Smokeless tobacco: Never Used  Substance Use Topics  . Alcohol use: Yes    Comment: occasionally  . Drug use: No     Allergies   Penicillins; Dilaudid [hydromorphone hcl]; and Wheat bran   Review of Systems Review of Systems  All other systems reviewed and are negative.    Physical Exam Updated Vital Signs BP 139/74 (BP Location: Left Arm)   Pulse 78   Temp 98.2 F (36.8 C) (Oral)   Resp 20   Ht 5' 9"  (1.753 m)   Wt 127 kg (280 lb)   LMP 05/01/2011   SpO2 100%   BMI 41.35 kg/m   Physical Exam  Constitutional: She appears well-developed and well-nourished. No distress.  Obese female laying in bed appears uncomfortable  HENT:  Head: Atraumatic.  Eyes: Conjunctivae are normal.  Neck: Neck supple.  Cardiovascular: Normal rate and regular rhythm.  Pulmonary/Chest: Effort normal and breath sounds normal.  Abdominal: Soft. Normal appearance. There is tenderness in the periumbilical area, suprapubic area and left lower quadrant. There is no tenderness at McBurney's point and negative Murphy's sign. Hernia confirmed negative in the right inguinal area and confirmed negative in the left inguinal area.  Genitourinary:  Genitourinary Comments: Chaperone present during exam.  No inguinal lymphadenopathy or inguinal hernia noted.  Normal external genitalia.  Discomfort with speculum insertion, no obvious vaginal discharge, absent cervical loss due to prior hysterectomy.  On bimanual examination, Right adnexal tenderness, no CMT.  Musculoskeletal:  No midline spine tenderness.  No CVA tenderness.  Neurological: She is alert.  Skin: No rash noted.  Psychiatric: She has a normal mood and  affect.  Nursing note and vitals reviewed.    ED Treatments / Results  Labs (all labs ordered are listed, but only abnormal results are displayed) Labs Reviewed  WET PREP, GENITAL - Abnormal; Notable for the following components:      Result Value   Clue Cells Wet Prep HPF POC PRESENT (*)    WBC, Wet Prep HPF POC MANY (*)    All other components within normal limits  COMPREHENSIVE METABOLIC PANEL - Abnormal; Notable for the following components:   Sodium 133 (*)    Glucose, Bld 107 (*)    Calcium 8.7 (*)    All other components within normal limits  URINALYSIS, ROUTINE W REFLEX MICROSCOPIC - Abnormal; Notable for the following components:   Specific Gravity, Urine <1.005 (*)    All other components within normal limits  CBC WITH DIFFERENTIAL/PLATELET  LIPASE, BLOOD  GC/CHLAMYDIA PROBE AMP ([place]) NOT AT [facility]    EKG  EKG Interpretation None       Radiology Ct Abdomen Pelvis W Contrast  Result Date: 06/08/2017 CLINICAL DATA:  Right lower quadrant abdominal pain x3 days. EXAM: CT ABDOMEN AND PELVIS WITH CONTRAST TECHNIQUE: Multidetector CT imaging of the abdomen and pelvis was performed using the standard protocol following bolus administration of intravenous contrast. CONTRAST:  153m ISOVUE-300 IOPAMIDOL (ISOVUE-300) INJECTION 61% COMPARISON:  07/27/2013 CT and 12/27/2013 CT report FINDINGS: Lower chest: No acute abnormality. Hepatobiliary: No focal liver abnormality is seen. Status post cholecystectomy. No biliary dilatation. Pancreas: Normal Spleen: Normal Adrenals/Urinary Tract: Normal Stomach/Bowel: Normal-appearing appendix. Decompressed stomach with normal small bowel rotation. Mild distention of small bowel loops in the left hemiabdomen with slight fold thickening. This may represent a mild small bowel enteritis or simply peristaltic change. No mechanical source of bowel obstruction is seen. The colon is unremarkable without inflammation. Vascular/Lymphatic: No  significant vascular findings are present. No enlarged abdominal or pelvic lymph nodes. Reproductive: Status post hysterectomy. Redemonstration of a small mid pelvic mass, smaller than on the study of 07/27/2013 and less cystic in appearance. This likely represents the right ovary. This measures approximately 4 x 2.9 cm and was noted to be approximately 4.7 x 3.3 cm on the report from 12/27/2013. Other: Anterior abdominal wall scarring is noted. No abdominopelvic ascites. Musculoskeletal: No acute or significant osseous findings. Lumbar spondylosis is redemonstrated with marked L4-5 and L5-S1 degenerative disc disease with moderate to severe neural foraminal encroachment bilaterally. IMPRESSION: 1. No acute bowel inflammation or obstruction is noted. There are a few mildly distended small bowel loops in the left hemiabdomen containing contrast and likely representing normal peristaltic change. The possibility of a mild enteritis is not entirely excluded. 2. Status post hysterectomy. Stable small pelvic mass likely represents the patient's right ovary, smaller in appearance from 12/27/2013. However, in light of the patient's right lower quadrant pain, if torsion is of concern, pelvic ultrasound may prove useful. Electronically Signed   By: DAshley RoyaltyM.D.   On: 06/08/2017 22:08    Procedures Procedures (including critical care time)  Medications Ordered in ED Medications  morphine 4 MG/ML injection 4 mg (4 mg Intravenous Given 06/08/17 2013)  ondansetron (ZOFRAN) injection 4 mg (4 mg Intravenous Given 06/08/17 2011)     Initial Impression / Assessment and Plan / ED Course  I have reviewed the triage vital signs and the nursing notes.  Pertinent labs & imaging results that were available during my care of the patient were reviewed by me and considered in my medical decision making (see chart for details).     BP 139/77 (BP Location: Left Arm)   Pulse 67   Temp 98.2 F (36.8 C) (Oral)   Resp 18    Ht 5' 9"  (1.753 m)   Wt 127 kg (280 lb)   LMP 05/01/2011   SpO2 100%   BMI 41.35 kg/m    Final Clinical Impressions(s) / ED Diagnoses   Final diagnoses:  Abdominal pain, RLQ    ED Discharge Orders        Ordered    ondansetron (ZOFRAN ODT) 4 MG disintegrating tablet  Every 8 hours PRN     06/09/17 0053     8:50 PM Patient here with pain to her back and abdomen.  States feels similar to prior ovarian cyst pain.  She appears very uncomfortable and would likely benefit from advanced imaging.  Given her lower abdominal discomfort, will perform pelvic examination.  She mentioned having partial hysterectomy.  8:50 PM R side discomfort with pelvic examination.  Evidence of clue cells and many WBC noted on wet prep.  Labs otherwise reassuring.  However, due to her discomfort, will obtain abdominal pelvic CT scan for further evaluation.  11:04 PM Wet prep shows evidence of clue cells and many WBC.  Initial abdominal and pelvis CT scan without any acute abnormalities.  However, radiologist does recommend pelvic ultrasound for her right lower quadrant pain to rule out torsion.  States the pain is primarily to the right lower quadrant and she has normal appendix, plan to obtain ultrasound for further evaluation.  We will continue with pain management.  Her labs are otherwise reassuring, urine without signs of urinary tract infection.  12:35 AM Wet prep showing clue cells, however pt does not report vaginal discomfort and no strong odor to suggest BV.  Many WBC were noted but pt has had hysterectomy and not concern for PID.  Therefore, will wait for culture report prior to treating.   12:37 AM Pt sign out to Dr. Leonides Schanz who agrees f/u on [place] and determine disposition.  If [place] negative, then pt stable for discharge.     Domenic Moras, PA-C 06/09/17 1506    Tegeler, Gwenyth Allegra, MD 06/12/17 [phone]

## 2017-06-09 MED ORDER — ONDANSETRON 4 MG PO TBDP: 4.0000 mg | ORAL_TABLET | Freq: Three times a day (TID) | ORAL | 0 refills | Status: DC | PRN

## 2017-06-09 NOTE — ED Provider Notes (Signed)
12:45 AM  Assumed care from Domenic Moras, PA.  Patient is a 42 year old female who presented to the emergency department 3 days of right lower quadrant pain and nausea.  No vomiting or diarrhea.  Status post hysterectomy and left oophorectomy.  Labs are unremarkable.  Urine shows no sign of infection.  Wet prep positive for clue cells but no significant discharge or odor per PA.  CT scan showed mildly distended bowel loops within the small bowel that may represent normal peristalsis versus mild enteritis.  She also had a small pelvic mass that likely represented the patient's right ovary that appears smaller than previous imaging in 2015.  Transvaginal ultrasound was obtained for further evaluation.  Right ovary not clearly identified on ultrasound.  There is moderate free fluid within the pelvis.  May be reactive.  Given ovary not identified, I feel this is less likely torsion.  No pelvic mass identified on ultrasound.  Left ovary and uterus surgically absent.  Have recommended close follow-up with her primary care physician.  I feel she may need repeat ultrasound with OB/GYN if symptoms are not improving.  Symptoms present for 3 days.  She is afebrile and nontoxic here.  Recommended alternating Tylenol and Motrin for pain.  Will discharge with prescription of Zofran as well for nausea.   At this time, I do not feel there is any life-threatening condition present. I have reviewed and discussed all results (EKG, imaging, lab, urine as appropriate) and exam findings with patient/family. I have reviewed nursing notes and appropriate previous records.  I feel the patient is safe to be discharged home without further emergent workup and can continue workup as an outpatient as needed. Discussed usual and customary return precautions. Patient/family verbalize understanding and are comfortable with this plan.  Outpatient follow-up has been provided if needed. All questions have been answered.    Shyhiem Beeney, Delice Bison,  DO 06/09/17 [phone]

## 2017-06-09 NOTE — Discharge Instructions (Addendum)
You may alternate Tylenol 1000 mg every 6 hours as needed for pain and Ibuprofen 800 mg every 8 hours as needed for pain.  Please take Ibuprofen with food. ° °

## 2017-06-09 NOTE — ED Notes (Signed)
Pt. Is in [place] at present time.

## 2017-06-09 NOTE — ED Notes (Signed)
Pt. Has returned from [place]

## 2017-06-10 LAB — GC/CHLAMYDIA PROBE AMP (~~LOC~~) NOT AT ARMC
Chlamydia: NEGATIVE
NEISSERIA GONORRHEA: NEGATIVE

## 2018-02-12 ENCOUNTER — Other Ambulatory Visit: Payer: Self-pay

## 2018-02-12 ENCOUNTER — Emergency Department (HOSPITAL_BASED_OUTPATIENT_CLINIC_OR_DEPARTMENT_OTHER): Payer: BLUE CROSS/BLUE SHIELD

## 2018-02-12 ENCOUNTER — Encounter (HOSPITAL_BASED_OUTPATIENT_CLINIC_OR_DEPARTMENT_OTHER): Payer: Self-pay

## 2018-02-12 ENCOUNTER — Emergency Department (HOSPITAL_BASED_OUTPATIENT_CLINIC_OR_DEPARTMENT_OTHER)
Admission: EM | Admit: 2018-02-12 | Discharge: 2018-02-12 | Disposition: A | Discharge status: Home / Self Care | Payer: BLUE CROSS/BLUE SHIELD | Attending: Emergency Medicine | Admitting: Emergency Medicine

## 2018-02-12 DIAGNOSIS — N83201 Unspecified ovarian cyst, right side: Secondary | ICD-10-CM

## 2018-02-12 DIAGNOSIS — Z79899 Other long term (current) drug therapy: Secondary | ICD-10-CM | POA: Diagnosis not present

## 2018-02-12 DIAGNOSIS — R1031 Right lower quadrant pain: Secondary | ICD-10-CM | POA: Diagnosis present

## 2018-02-12 LAB — CBC
HCT: 44.9 % (ref 36.0–46.0)
HEMOGLOBIN: 14.8 g/dL (ref 12.0–15.0)
MCH: 30.5 pg (ref 26.0–34.0)
MCHC: 33 g/dL (ref 30.0–36.0)
MCV: 92.4 fL (ref 80.0–100.0)
PLATELETS: 229 10*3/uL (ref 150–400)
RBC: 4.86 MIL/uL (ref 3.87–5.11)
RDW: 12.4 % (ref 11.5–15.5)
WBC: 7.3 10*3/uL (ref 4.0–10.5)
nRBC: 0 % (ref 0.0–0.2)

## 2018-02-12 LAB — COMPREHENSIVE METABOLIC PANEL
ALK PHOS: 49 U/L (ref 38–126)
ALT: 85 U/L — ABNORMAL HIGH (ref 0–44)
AST: 64 U/L — AB (ref 15–41)
Albumin: 4.5 g/dL (ref 3.5–5.0)
Anion gap: 10 (ref 5–15)
BILIRUBIN TOTAL: 1.1 mg/dL (ref 0.3–1.2)
BUN: 14 mg/dL (ref 6–20)
CALCIUM: 9.5 mg/dL (ref 8.9–10.3)
CO2: 26 mmol/L (ref 22–32)
Chloride: 102 mmol/L (ref 98–111)
Creatinine, Ser: 1.16 mg/dL — ABNORMAL HIGH (ref 0.44–1.00)
GFR calc Af Amer: >60 mL/min (ref >60)
GFR, EST NON AFRICAN AMERICAN: 57 mL/min — AB (ref >60)
Glucose, Bld: 106 mg/dL — ABNORMAL HIGH (ref 70–99)
POTASSIUM: 4.3 mmol/L (ref 3.5–5.1)
Sodium: 138 mmol/L (ref 135–145)
TOTAL PROTEIN: 7.7 g/dL (ref 6.5–8.1)

## 2018-02-12 LAB — URINALYSIS, ROUTINE W REFLEX MICROSCOPIC
Glucose, UA: NEGATIVE mg/dL
HGB URINE DIPSTICK: NEGATIVE
KETONES UR: 15 mg/dL — AB
Leukocytes, UA: NEGATIVE
NITRITE: NEGATIVE
Protein, ur: NEGATIVE mg/dL
Specific Gravity, Urine: 1.025 (ref 1.005–1.030)
pH: 6.5 (ref 5.0–8.0)

## 2018-02-12 LAB — LIPASE, BLOOD: Lipase: 35 U/L (ref 11–51)

## 2018-02-12 MED ORDER — ONDANSETRON HCL 4 MG/2ML IJ SOLN
4.0000 mg | Freq: Once | INTRAMUSCULAR | Status: AC | PRN
  Filled 2018-02-12: qty 2

## 2018-02-12 MED ORDER — MORPHINE SULFATE (PF) 4 MG/ML IV SOLN
4.0000 mg | Freq: Once | INTRAVENOUS | Status: AC | PRN
  Administered 2018-02-12: 4 mg via INTRAVENOUS
  Filled 2018-02-12 (×2): qty 1

## 2018-02-12 MED ORDER — MELOXICAM 15 MG PO TABS: 15.0000 mg | ORAL_TABLET | Freq: Every day | ORAL | 0 refills | Status: DC

## 2018-02-12 MED ORDER — TRAMADOL HCL 50 MG PO TABS: 50.0000 mg | ORAL_TABLET | Freq: Four times a day (QID) | ORAL | 0 refills | Status: DC | PRN

## 2018-02-12 MED ORDER — IOPAMIDOL (ISOVUE-300) INJECTION 61%
100.0000 mL | Freq: Once | INTRAVENOUS | Status: AC | PRN
  Administered 2018-02-12: 100 mL via INTRAVENOUS

## 2018-02-12 NOTE — ED Triage Notes (Signed)
C/o abd pain, n/v-started yesterday-was seen by PCP today and sent to ED-pt with hx of right ovarian cyst-PCP concerned possible appendicitis-NAD-steady gait

## 2018-02-12 NOTE — Discharge Instructions (Signed)
Contact a health care provider if: Your periods are late, irregular, or painful, or they stop. You have pelvic pain that does not go away. You have pressure on your bladder or trouble emptying your bladder completely. You have pain during sex. You have any of the following in your abdomen: A feeling of fullness. Pressure. Discomfort. Pain that does not go away. Swelling. You feel generally ill. You become constipated. You lose your appetite. You develop severe acne. You start to have more body hair and facial hair. You are gaining weight or losing weight without changing your exercise and eating habits. You think you may be pregnant. Get help right away if: You have abdominal pain that is severe or gets worse. You cannot eat or drink without vomiting. You suddenly develop a fever. Your menstrual period is much heavier than usual.

## 2018-02-12 NOTE — ED Provider Notes (Signed)
[facility] EMERGENCY DEPARTMENT Provider Note   CSN: [id number] Arrival date & time: 02/12/18  1623     History   Chief Complaint Chief Complaint  Patient presents with  . Abdominal Pain    HPI Sheri Evans is a 42 y.o. female who presents the emergency department with chief complaint of abdominal pain.  Patient sudden onset of severe pain right around her bellybutton in the right side of her abdomen starting last night with associated nausea and several episodes of vomiting.  At times her pain was 10 out of 10.  She states that it hurt anytime she would get jostled or hit a bump driving home from taking her daughter shopping.  Previous history of cholecystectomy.  She has had multiple laparoscopic surgeries including a left nephrectomy and partial hysterectomy as well as adhesions surgeries.  She also has a history of celiac sprue.  Patient has 4 out of 10 pain at rest 6 out of 10 at 10 pain when she moves.  It does not radiate.  She denies any fever or chills.  She denies urinary symptoms.  She has no nausea at this time.  She denies flank pain.  She does not have menstrual periods because of her previous hysterectomy.  HPI  Past Medical History:  Diagnosis Date  . Bursitis of shoulder 09/2014   right  . Celiac disease   . Migraines   . 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 Ovarian cyst   . Poison ivy 09/30/2014   posterior right knee  . PONV (postoperative nausea and vomiting)   . Rotator cuff tear 09/2014   right shoulder    Patient Active Problem List   Diagnosis Date Noted  . Right rotator cuff tear 10/03/2014    Past Surgical History:  Procedure Laterality Date  . ABDOMINAL HYSTERECTOMY  2013   partial  . CARPAL TUNNEL RELEASE Right   . [place], 2000, 05/07/2002   x 3  . CHOLECYSTECTOMY    . ENDOMETRIAL ABLATION  04/30/2010  . ENDOMETRIAL ABLATION W/ NOVASURE  12/22/2006  . HYSTEROSCOPY W/D&C  12/22/2006  . LAPAROSCOPY Right  01/23/2013   Procedure: LAPAROSCOPY OPERATIVE, Right Ovarian Cystectomy, Lysis of Adhesions;  Surgeon: Allena Katz, MD;  Location: [facility];  Service: Gynecology;  Laterality: Right;  . SHOULDER ARTHROSCOPY WITH DISTAL CLAVICLE RESECTION Right 10/03/2014   Procedure: SHOULDER ARTHROSCOPY WITH DISTAL CLAVICLE RESECTION;  Surgeon: Marchia Bond, MD;  Location: [facility];  Service: Orthopedics;  Laterality: Right;  . SHOULDER ARTHROSCOPY WITH ROTATOR CUFF REPAIR AND SUBACROMIAL DECOMPRESSION Right 10/03/2014   Procedure: RIGHT SHOULDER ARTHROSCOPY DEBRIDEMENT,ACROMIOPLASTY,DISTAL CLAVICLE EXCISION,ROTATOR CUFF REPAIR ;  Surgeon: Marchia Bond, MD;  Location: [facility];  Service: Orthopedics;  Laterality: Right;  . TONSILLECTOMY       OB History    Gravida  3   Para  3   Term  3   Preterm  0   AB  0   Living  3     SAB  0   TAB  0   Ectopic  0   Multiple  0   Live Births               Home Medications    Prior to Admission medications   Medication Sig Start Date End Date Taking? Authorizing Provider  EPINEPHrine (ADRENACLICK) 0.3 QJ/1.9 mL IJ SOAJ injection Inject 0.3 mLs (0.3 mg total) into the muscle once. 04/29/15  Ward, Delice Bison, DO  famotidine (PEPCID) 20 MG tablet Take 1 tablet (20 mg total) by mouth 2 (two) times daily. 04/29/15   Ward, Delice Bison, DO  HYDROcodone-acetaminophen (NORCO/VICODIN) 5-325 MG per tablet Take 1 tablet by mouth every 6 (six) hours as needed for moderate pain.    [provider]  ibuprofen (ADVIL,MOTRIN) 200 MG tablet Take 800 mg by mouth every 6 (six) hours as needed.    [provider]  methocarbamol (ROBAXIN) 500 MG tablet Take 1 tablet (500 mg total) by mouth 4 (four) times daily. 10/03/14   Marchia Bond, MD  Multiple Vitamin (MULTIVITAMIN WITH MINERALS) TABS tablet Take 1 tablet by mouth daily.    [provider]  ondansetron (ZOFRAN ODT) 4 MG disintegrating tablet Take 1 tablet  (4 mg total) by mouth every 8 (eight) hours as needed for nausea or vomiting. 06/09/17   Ward, Delice Bison, DO  oxyCODONE-acetaminophen (PERCOCET) 10-325 MG per tablet Take 1-2 tablets by mouth every 6 (six) hours as needed for pain. 10/03/14   Marchia Bond, MD  predniSONE (DELTASONE) 20 MG tablet Take 3 tablets (60 mg total) by mouth daily. 04/29/15   Ward, Delice Bison, DO    Family History No family history on file.  Social History Social History   Tobacco Use  . Smoking status: Never Smoker  . Smokeless tobacco: Never Used  Substance Use Topics  . Alcohol use: Yes    Comment: occasionally  . Drug use: No     Allergies   Penicillins; Dilaudid [hydromorphone hcl]; and Wheat bran   Review of Systems Review of Systems  Ten systems reviewed and are negative for acute change, except as noted in the HPI.   Physical Exam Updated Vital Signs BP 135/62 (BP Location: Left Arm)   Pulse 78   Temp 98.3 F (36.8 C) (Oral)   Resp 16   Ht 5' 10"  (1.778 m)   Wt 132.5 kg   LMP 05/01/2011   SpO2 100%   BMI 41.90 kg/m   Physical Exam  Constitutional: She is oriented to person, place, and time. She appears well-developed and well-nourished. No distress.  HENT:  Head: Normocephalic and atraumatic.  Eyes: Conjunctivae are normal. No scleral icterus.  Neck: Normal range of motion.  Cardiovascular: Normal rate, regular rhythm and normal heart sounds. Exam reveals no gallop and no friction rub.  No murmur heard. Pulmonary/Chest: Effort normal and breath sounds normal. No respiratory distress.  Abdominal: Soft. Bowel sounds are normal. She exhibits no distension and no mass. There is tenderness in the right lower quadrant and periumbilical area. There is no guarding.  Neurological: She is alert and oriented to person, place, and time.  Skin: Skin is warm and dry. She is not diaphoretic.  Psychiatric: Her behavior is normal.  Nursing note and vitals reviewed.    ED Treatments / Results    Labs (all labs ordered are listed, but only abnormal results are displayed) Labs Reviewed  COMPREHENSIVE METABOLIC PANEL - Abnormal; Notable for the following components:      Result Value   Glucose, Bld 106 (*)    Creatinine, Ser 1.16 (*)    AST 64 (*)    ALT 85 (*)    GFR calc non Af Amer 57 (*)    All other components within normal limits  URINALYSIS, ROUTINE W REFLEX MICROSCOPIC - Abnormal; Notable for the following components:   Bilirubin Urine SMALL (*)    Ketones, ur 15 (*)  All other components within normal limits  LIPASE, BLOOD  CBC    EKG None  Radiology No results found.  Procedures Procedures (including critical care time)  Medications Ordered in ED Medications  morphine 4 MG/ML injection 4 mg (0 mg Intravenous Hold 02/12/18 1819)    Or  ondansetron (ZOFRAN) injection 4 mg ( Intravenous See Alternative 02/12/18 1819)  iopamidol (ISOVUE-300) 61 % injection 100 mL (has no administration in time range)     Initial Impression / Assessment and Plan / ED Course  I have reviewed the triage vital signs and the nursing notes.  Pertinent labs & imaging results that were available during my care of the patient were reviewed by me and considered in my medical decision making (see chart for details).    Patient with right lower quadrant abdominal pain.  I reviewed the patient's labs which showed no significant abnormality.  She does have a little uptake in her AST and ALT which I have asked with the patient.  The patient does not have an elevated white blood cell count.  Her CT scan showed some dependent fluid in the right pelvic region with loculation.  Follow-up ultrasound showed no evidence of ovarian torsion, and likely recent hemorrhagic cyst without loculated fluid.  The patient's pain has improved.  She does not appear to have any emergent cause of her pain.  She has a history of previous ruptured ovarian cysts and close follow-up with her OB/GYN.  Have advised  the patient to call her OB/GYN first thing tomorrow for follow-up and evaluation.  Her pain is greatly improved here in the emergency department.  She will be discharged with tramadol.  Discussed return precautions.   Final Clinical Impressions(s) / ED Diagnoses   Final diagnoses:  Cyst of right ovary    ED Discharge Orders    None       Margarita Mail, PA-C 02/13/18 0043    Jola Schmidt, MD 02/13/18 [phone]

## 2018-03-08 ENCOUNTER — Other Ambulatory Visit: Payer: Self-pay | Admitting: Obstetrics and Gynecology

## 2018-03-08 DIAGNOSIS — Z803 Family history of malignant neoplasm of breast: Secondary | ICD-10-CM

## 2018-09-30 ENCOUNTER — Emergency Department (HOSPITAL_BASED_OUTPATIENT_CLINIC_OR_DEPARTMENT_OTHER): Payer: BC Managed Care – PPO

## 2018-09-30 ENCOUNTER — Emergency Department (HOSPITAL_BASED_OUTPATIENT_CLINIC_OR_DEPARTMENT_OTHER)
Admission: EM | Admit: 2018-09-30 | Discharge: 2018-09-30 | Disposition: A | Discharge status: Home / Self Care | Payer: BC Managed Care – PPO | Attending: Emergency Medicine | Admitting: Emergency Medicine

## 2018-09-30 ENCOUNTER — Other Ambulatory Visit: Payer: Self-pay

## 2018-09-30 ENCOUNTER — Encounter (HOSPITAL_BASED_OUTPATIENT_CLINIC_OR_DEPARTMENT_OTHER): Payer: Self-pay | Admitting: *Deleted

## 2018-09-30 DIAGNOSIS — G518 Other disorders of facial nerve: Secondary | ICD-10-CM

## 2018-09-30 DIAGNOSIS — R6884 Jaw pain: Secondary | ICD-10-CM | POA: Diagnosis present

## 2018-09-30 DIAGNOSIS — G501 Atypical facial pain: Secondary | ICD-10-CM | POA: Insufficient documentation

## 2018-09-30 LAB — CBC WITH DIFFERENTIAL/PLATELET
Abs Immature Granulocytes: 0.03 10*3/uL (ref 0.00–0.07)
Basophils Absolute: 0 10*3/uL (ref 0.0–0.1)
Basophils Relative: 0 %
Eosinophils Absolute: 0 10*3/uL (ref 0.0–0.5)
Eosinophils Relative: 0 %
HCT: 43.1 % (ref 36.0–46.0)
Hemoglobin: 14.7 g/dL (ref 12.0–15.0)
Immature Granulocytes: 0 %
Lymphocytes Relative: 24 %
Lymphs Abs: 1.7 10*3/uL (ref 0.7–4.0)
MCH: 31.7 pg (ref 26.0–34.0)
MCHC: 34.1 g/dL (ref 30.0–36.0)
MCV: 92.9 fL (ref 80.0–100.0)
Monocytes Absolute: 0.6 10*3/uL (ref 0.1–1.0)
Monocytes Relative: 8 %
Neutro Abs: 4.9 10*3/uL (ref 1.7–7.7)
Neutrophils Relative %: 68 %
Platelets: 234 10*3/uL (ref 150–400)
RBC: 4.64 MIL/uL (ref 3.87–5.11)
RDW: 12.6 % (ref 11.5–15.5)
WBC: 7.2 10*3/uL (ref 4.0–10.5)
nRBC: 0 % (ref 0.0–0.2)

## 2018-09-30 LAB — BASIC METABOLIC PANEL
Anion gap: 11 (ref 5–15)
BUN: 12 mg/dL (ref 6–20)
CO2: 22 mmol/L (ref 22–32)
Calcium: 9 mg/dL (ref 8.9–10.3)
Chloride: 102 mmol/L (ref 98–111)
Creatinine, Ser: 0.87 mg/dL (ref 0.44–1.00)
GFR calc Af Amer: >60 mL/min (ref >60)
GFR calc non Af Amer: >60 mL/min (ref >60)
Glucose, Bld: 115 mg/dL — ABNORMAL HIGH (ref 70–99)
Potassium: 3.9 mmol/L (ref 3.5–5.1)
Sodium: 135 mmol/L (ref 135–145)

## 2018-09-30 MED ORDER — KETOROLAC TROMETHAMINE 15 MG/ML IJ SOLN
15.0000 mg | Freq: Once | INTRAMUSCULAR | Status: AC
  Administered 2018-09-30: 15 mg via INTRAVENOUS
  Filled 2018-09-30: qty 1

## 2018-09-30 MED ORDER — ONDANSETRON HCL 4 MG/2ML IJ SOLN
INTRAMUSCULAR | Status: AC
  Filled 2018-09-30: qty 2

## 2018-09-30 MED ORDER — ONDANSETRON HCL 4 MG/2ML IJ SOLN
4.0000 mg | Freq: Once | INTRAMUSCULAR | Status: AC
  Administered 2018-09-30: 4 mg via INTRAVENOUS

## 2018-09-30 MED ORDER — IOHEXOL 300 MG/ML  SOLN
100.0000 mL | Freq: Once | INTRAMUSCULAR | Status: AC | PRN
  Administered 2018-09-30: 15:00:00 80 mL via INTRAVENOUS

## 2018-09-30 NOTE — ED Provider Notes (Signed)
[facility] EMERGENCY DEPARTMENT Provider Note   CSN: [id number] Arrival date & time: 09/30/18  1408     History   Chief Complaint Chief Complaint  Patient presents with   Jaw Pain    HPI Sheri Evans is a 43 y.o. female.     Patient with history of migraine headaches presents to the emergency department with complaint of left-sided facial pain ongoing over the past 4 days.  Patient initially thought that she was developing a sinus infection.  She had pain underneath her left eye which then radiated to her occipital area and into her left jaw.  She had a telehealth visit and was prescribed doxycycline to treat possible sinusitis.  Patient states that the pain continues to get worse.  Last night she was unable to sleep because the pain was so bad.  She feels that she has some swelling underneath her left jaw.  The pain extends from the occipital area around the left cheek to the left jaw area.  No vision changes or vision loss.  Patient is able to fully open her jaw without pain.  No voice changes.  She has full range of motion of her neck.  She has taken Tylenol and ibuprofen without relief.  No dental problems.     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   Ovarian cyst    Poison ivy 09/30/2014   posterior right knee   PONV (postoperative nausea and vomiting)    Rotator cuff tear 09/2014   right shoulder    Patient Active Problem List   Diagnosis Date Noted   Right rotator cuff tear 10/03/2014    Past Surgical History:  Procedure Laterality Date   ABDOMINAL HYSTERECTOMY  2013   partial   CARPAL TUNNEL RELEASE Right    CESAREAN SECTION  1996, 2000, 05/07/2002   x 3   CHOLECYSTECTOMY     ENDOMETRIAL ABLATION  04/30/2010   ENDOMETRIAL ABLATION W/ NOVASURE  12/22/2006   HYSTEROSCOPY W/D&C  12/22/2006   LAPAROSCOPY Right 01/23/2013     Procedure: LAPAROSCOPY OPERATIVE, Right Ovarian Cystectomy, Lysis of Adhesions;  Surgeon: Allena Katz, MD;  Location: [facility];  Service: Gynecology;  Laterality: Right;   SHOULDER ARTHROSCOPY WITH DISTAL CLAVICLE RESECTION Right 10/03/2014   Procedure: SHOULDER ARTHROSCOPY WITH DISTAL CLAVICLE RESECTION;  Surgeon: Marchia Bond, MD;  Location: [facility];  Service: Orthopedics;  Laterality: Right;   SHOULDER ARTHROSCOPY WITH ROTATOR CUFF REPAIR AND SUBACROMIAL DECOMPRESSION Right 10/03/2014   Procedure: RIGHT SHOULDER ARTHROSCOPY DEBRIDEMENT,ACROMIOPLASTY,DISTAL CLAVICLE EXCISION,ROTATOR CUFF REPAIR ;  Surgeon: Marchia Bond, MD;  Location: [facility];  Service: Orthopedics;  Laterality: Right;   TONSILLECTOMY       OB History    Gravida  3   Para  3   Term  3   Preterm  0   AB  0   Living  3     SAB  0   TAB  0   Ectopic  0   Multiple  0   Live Births               Home Medications    Prior to Admission medications   Medication Sig Start Date End Date Taking? Authorizing Provider  EPINEPHrine (ADRENACLICK) 0.3 TM/1.9 mL IJ SOAJ injection Inject 0.3 mLs (0.3 mg total) into  the muscle once. 04/29/15   Ward, Delice Bison, DO  famotidine (PEPCID) 20 MG tablet Take 1 tablet (20 mg total) by mouth 2 (two) times daily. 04/29/15   Ward, Delice Bison, DO  HYDROcodone-acetaminophen (NORCO/VICODIN) 5-325 MG per tablet Take 1 tablet by mouth every 6 (six) hours as needed for moderate pain.    [provider]  ibuprofen (ADVIL,MOTRIN) 200 MG tablet Take 800 mg by mouth every 6 (six) hours as needed.    [provider]  meloxicam (MOBIC) 15 MG tablet Take 1 tablet (15 mg total) by mouth daily. Take 1 daily with food. 02/12/18   Harris, Vernie Shanks, PA-C  methocarbamol (ROBAXIN) 500 MG tablet Take 1 tablet (500 mg total) by mouth 4 (four) times daily. 10/03/14   Marchia Bond, MD  Multiple Vitamin (MULTIVITAMIN WITH MINERALS) TABS tablet  Take 1 tablet by mouth daily.    [provider]  ondansetron (ZOFRAN ODT) 4 MG disintegrating tablet Take 1 tablet (4 mg total) by mouth every 8 (eight) hours as needed for nausea or vomiting. 06/09/17   Ward, Delice Bison, DO  oxyCODONE-acetaminophen (PERCOCET) 10-325 MG per tablet Take 1-2 tablets by mouth every 6 (six) hours as needed for pain. 10/03/14   Marchia Bond, MD  predniSONE (DELTASONE) 20 MG tablet Take 3 tablets (60 mg total) by mouth daily. 04/29/15   Ward, Delice Bison, DO  traMADol (ULTRAM) 50 MG tablet Take 1 tablet (50 mg total) by mouth every 6 (six) hours as needed. 02/12/18   Margarita Mail, PA-C    Family History No family history on file.  Social History Social History   Tobacco Use   Smoking status: Never Smoker   Smokeless tobacco: Never Used  Substance Use Topics   Alcohol use: Yes    Comment: occasionally   Drug use: No     Allergies   Penicillins, Dilaudid [hydromorphone hcl], and Wheat bran   Review of Systems Review of Systems  Constitutional: Negative for fever.  HENT: Positive for facial swelling, sinus pressure and sinus pain. Negative for ear pain, rhinorrhea and sore throat.   Eyes: Negative for redness.  Respiratory: Negative for cough.   Cardiovascular: Negative for chest pain.  Gastrointestinal: Negative for abdominal pain, diarrhea, nausea and vomiting.  Genitourinary: Negative for dysuria.  Musculoskeletal: Negative for myalgias.  Skin: Negative for rash.  Neurological: Negative for headaches.     Physical Exam Updated Vital Signs BP (!) 171/89 (BP Location: Right Arm)    Pulse 73    Temp 98.4 F (36.9 C) (Oral)    Resp 18    Ht 5' 9"  (1.753 m)    Wt 127 kg    LMP 05/01/2011    SpO2 99%    BMI 41.35 kg/m   Physical Exam Vitals signs and nursing note reviewed.  Constitutional:      General: She is in acute distress (Patient is crying).     Appearance: She is well-developed.  HENT:     Head: Normocephalic and  atraumatic.     Jaw: No trismus.     Comments: Tenderness and trace swelling under the angle of the L jaw.     Right Ear: Tympanic membrane, ear canal and external ear normal.     Left Ear: Tympanic membrane, ear canal and external ear normal.     Nose: No mucosal edema or rhinorrhea.     Right Sinus: No maxillary sinus tenderness or frontal sinus tenderness.     Left Sinus: Maxillary sinus  tenderness present. No frontal sinus tenderness.     Mouth/Throat:     Mouth: Mucous membranes are not dry. No oral lesions.     Pharynx: Uvula midline. No oropharyngeal exudate, posterior oropharyngeal erythema or uvula swelling.     Tonsils: No tonsillar abscesses.     Comments: No tenderness or swelling under the tongue. Eyes:     General:        Right eye: No discharge.        Left eye: No discharge.     Conjunctiva/sclera: Conjunctivae normal.  Neck:     Musculoskeletal: Normal range of motion and neck supple.  Cardiovascular:     Rate and Rhythm: Normal rate and regular rhythm.     Heart sounds: Normal heart sounds.  Pulmonary:     Effort: Pulmonary effort is normal. No respiratory distress.     Breath sounds: Normal breath sounds. No wheezing or rales.  Abdominal:     Palpations: Abdomen is soft.     Tenderness: There is no abdominal tenderness.  Lymphadenopathy:     Cervical: No cervical adenopathy.  Skin:    General: Skin is warm and dry.  Neurological:     Mental Status: She is alert.      ED Treatments / Results  Labs (all labs ordered are listed, but only abnormal results are displayed) Labs Reviewed  BASIC METABOLIC PANEL - Abnormal; Notable for the following components:      Result Value   Glucose, Bld 115 (*)    All other components within normal limits  CBC WITH DIFFERENTIAL/PLATELET    EKG    Radiology Ct Maxillofacial W Contrast  Addendum Date: 09/30/2018   ADDENDUM REPORT: 09/30/2018 16:10 IMPRESSION: Unremarkable maxillofacial CT. Electronically Signed    By: Logan Bores M.D.   On: 09/30/2018 16:10   Result Date: 09/30/2018 CLINICAL DATA:  Left-sided facial, ear, and neck pain. Headache and congestion. EXAM: CT MAXILLOFACIAL WITH CONTRAST TECHNIQUE: Multidetector CT imaging of the maxillofacial structures was performed with intravenous contrast. Multiplanar CT image reconstructions were also generated. CONTRAST:  15m OMNIPAQUE IOHEXOL 300 MG/ML  SOLN COMPARISON:  Neck CT 12/20/2011 FINDINGS: Osseous: No fracture or destructive osseous process. Orbits: Unremarkable. Sinuses: Paranasal sinuses and mastoid air cells are clear. Soft tissues: Symmetric and normal appearance of the parotid and submandibular glands without evidence of inflammation, mass, or stone. No enlarged lymph nodes in the upper neck. Limited intracranial: Unremarkable. Electronically Signed: By: ALogan BoresM.D. On: 09/30/2018 16:06    Procedures Procedures (including critical care time)  Medications Ordered in ED Medications  ketorolac (TORADOL) 15 MG/ML injection 15 mg (has no administration in time range)     Initial Impression / Assessment and Plan / ED Course  I have reviewed the triage vital signs and the nursing notes.  Pertinent labs & imaging results that were available during my care of the patient were reviewed by me and considered in my medical decision making (see chart for details).        Patient seen and examined.  Overall patient has a relatively benign exam.  However the patient is in distress, crying in the room because the pain is so bad in her face.  She has been on treatment for acute sinusitis.  Unclear etiology of the patient's symptoms.  Discussed continued symptomatic treatment versus imaging with patient.  She agrees to proceed with maxillofacial imaging of the face to rule out deep infection or other problems.  Feel this is reasonable  given patient's pain out of proportion to her exam as well as no improvement on antibiotic treatment.  Vital  signs reviewed and are as follows: BP (!) 171/89 (BP Location: Right Arm)    Pulse 73    Temp 98.4 F (36.9 C) (Oral)    Resp 18    Ht 5' 9"  (1.753 m)    Wt 127 kg    LMP 05/01/2011    SpO2 99%    BMI 41.35 kg/m   4:53 PM CT reviewed by myself.  No acute abnormality noted per radiologist read.  Discussed with patient.  She will continue over-the-counter medications and follow-up with her primary care doctor.  Discussed that her pain may be more neuropathic in nature and would need appropriate work-up and treatment as an outpatient if this is felt likely.  Comfortable discharged home at this time.  She will discontinue doxycycline.  Patient urged to return with worsening symptoms or other concerns. Patient verbalized understanding and agrees with plan.    Final Clinical Impressions(s) / ED Diagnoses   Final diagnoses:  Craniofacial pain syndrome   Patient with severe left-sided facial pain.  Previously given antibiotics for sinusitis which has not helped.  Given pain out of proportion to exam today, CT performed.  This does not demonstrate any deep space infections or other concerning findings.  Given the patient's description of severe pain, this may be neuropathic in nature.  She will follow-up with her PCP as discussed above.  ED Discharge Orders    None       Carlisle Cater, Hershal Coria 09/30/18 1659    Veryl Speak, MD 10/03/18 2320

## 2018-09-30 NOTE — ED Triage Notes (Addendum)
Pt c/o pain that "jumps" from jaw, neck, back of head and sinuses. She had a televisit on 6/24 and was prescribed doxycycline for a sinus infection. C/o nausea and states she feels shaky

## 2018-09-30 NOTE — ED Notes (Signed)
Pt is on day 3 of doxy.

## 2018-09-30 NOTE — ED Notes (Signed)
Patient transported to CT 

## 2018-09-30 NOTE — Discharge Instructions (Signed)
Please read and follow all provided instructions.  Your diagnoses today include:  1. Craniofacial pain syndrome     Tests performed today include:  CT of your face - does not show sinus infection or other infection in the face  Vital signs. See below for your results today.   Medications prescribed:   None  Take any prescribed medications only as directed.  Home care instructions:  Follow any educational materials contained in this packet.  Follow-up instructions: Please follow-up with your primary care provider in the next 3 days for further evaluation of your symptoms.   Return instructions:   Please return to the Emergency Department if you experience worsening symptoms.   Please return if you have any other emergent concerns.  Additional Information:  Your vital signs today were: BP (!) 171/89 (BP Location: Right Arm)    Pulse 73    Temp 98.4 F (36.9 C) (Oral)    Resp 18    Ht 5' 9"  (1.753 m)    Wt 127 kg    LMP 05/01/2011    SpO2 99%    BMI 41.35 kg/m  If your blood pressure (BP) was elevated above 135/85 this visit, please have this repeated by your doctor within one month. --------------

## 2018-09-30 NOTE — ED Notes (Signed)
Pt reports feeling "shaky" but denies dizziness.

## 2019-02-11 ENCOUNTER — Other Ambulatory Visit: Payer: Self-pay

## 2019-02-11 DIAGNOSIS — Z20822 Contact with and (suspected) exposure to covid-19: Secondary | ICD-10-CM

## 2019-02-12 LAB — NOVEL CORONAVIRUS, NAA: SARS-CoV-2, NAA: NOT DETECTED

## 2019-03-12 ENCOUNTER — Other Ambulatory Visit: Payer: Self-pay | Admitting: Sports Medicine

## 2019-03-12 DIAGNOSIS — M542 Cervicalgia: Secondary | ICD-10-CM

## 2019-03-12 DIAGNOSIS — M545 Low back pain, unspecified: Secondary | ICD-10-CM

## 2019-04-03 ENCOUNTER — Ambulatory Visit
Admission: RE | Admit: 2019-04-03 | Discharge: 2019-04-03 | Disposition: A | Discharge status: Home / Self Care | Payer: BC Managed Care – PPO | Source: Ambulatory Visit | Attending: Sports Medicine | Admitting: Sports Medicine

## 2019-04-03 ENCOUNTER — Other Ambulatory Visit: Payer: Self-pay

## 2019-04-03 DIAGNOSIS — M545 Low back pain, unspecified: Secondary | ICD-10-CM

## 2019-04-03 DIAGNOSIS — M542 Cervicalgia: Secondary | ICD-10-CM

## 2019-08-08 ENCOUNTER — Other Ambulatory Visit: Payer: Self-pay | Admitting: Obstetrics and Gynecology

## 2019-08-08 DIAGNOSIS — Z9189 Other specified personal risk factors, not elsewhere classified: Secondary | ICD-10-CM

## 2019-09-23 ENCOUNTER — Other Ambulatory Visit: Payer: BC Managed Care – PPO

## 2019-10-17 ENCOUNTER — Other Ambulatory Visit: Payer: Self-pay | Admitting: Neurological Surgery

## 2019-10-17 DIAGNOSIS — M542 Cervicalgia: Secondary | ICD-10-CM

## 2019-10-26 ENCOUNTER — Ambulatory Visit
Admission: RE | Admit: 2019-10-26 | Discharge: 2019-10-26 | Disposition: A | Discharge status: Home / Self Care | Payer: BC Managed Care – PPO | Source: Ambulatory Visit | Attending: Obstetrics and Gynecology | Admitting: Obstetrics and Gynecology

## 2019-10-26 DIAGNOSIS — Z9189 Other specified personal risk factors, not elsewhere classified: Secondary | ICD-10-CM

## 2019-10-26 MED ORDER — GADOBUTROL 1 MMOL/ML IV SOLN
10.0000 mL | Freq: Once | INTRAVENOUS | Status: AC | PRN
  Administered 2019-10-26: 10 mL via INTRAVENOUS

## 2019-10-30 ENCOUNTER — Other Ambulatory Visit: Payer: Self-pay

## 2019-10-30 ENCOUNTER — Ambulatory Visit
Admission: RE | Admit: 2019-10-30 | Discharge: 2019-10-30 | Disposition: A | Discharge status: Home / Self Care | Payer: BC Managed Care – PPO | Source: Ambulatory Visit | Attending: Neurological Surgery | Admitting: Neurological Surgery

## 2019-10-30 DIAGNOSIS — M542 Cervicalgia: Secondary | ICD-10-CM

## 2019-11-21 ENCOUNTER — Ambulatory Visit: Payer: BC Managed Care – PPO | Admitting: Physical Therapy

## 2019-12-19 ENCOUNTER — Ambulatory Visit: Payer: BC Managed Care – PPO | Admitting: Neurology

## 2019-12-20 ENCOUNTER — Other Ambulatory Visit: Payer: Self-pay

## 2019-12-20 ENCOUNTER — Encounter: Payer: Self-pay | Admitting: Physical Therapy

## 2019-12-20 ENCOUNTER — Ambulatory Visit: Payer: BC Managed Care – PPO | Attending: Neurological Surgery | Admitting: Physical Therapy

## 2019-12-20 DIAGNOSIS — M542 Cervicalgia: Secondary | ICD-10-CM | POA: Diagnosis not present

## 2019-12-20 DIAGNOSIS — M62838 Other muscle spasm: Secondary | ICD-10-CM | POA: Diagnosis present

## 2019-12-20 DIAGNOSIS — R293 Abnormal posture: Secondary | ICD-10-CM | POA: Insufficient documentation

## 2019-12-20 NOTE — Therapy (Signed)
[facility] [place] [address]  [place], [place], [address] Phone: [phone]   Fax:  [phone]  Physical Therapy Evaluation  Patient Details  Name: Sheri Evans MRN: [id number] Date of Birth: 10-08-1975 Referring Provider (PT): Eustace Moore, MD   Encounter Date: 12/20/2019   PT End of Session - 12/20/19 1016    Visit Number 1    Number of Visits 12    Date for PT Re-Evaluation 01/31/20    Authorization Type BCBS    PT Start Time 1016    PT Stop Time 1102    PT Time Calculation (min) 46 min    Activity Tolerance Patient tolerated treatment well    Behavior During Therapy [facility] for tasks assessed/performed           Past Medical History:  Diagnosis Date  . Bursitis of shoulder 09/2014   right  . Celiac disease   . Migraines   . 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 Ovarian cyst   . Poison ivy 09/30/2014   posterior right knee  . PONV (postoperative nausea and vomiting)   . Rotator cuff tear 09/2014   right shoulder    Past Surgical History:  Procedure Laterality Date  . ABDOMINAL HYSTERECTOMY  2013   partial  . CARPAL TUNNEL RELEASE Right   . [place], 2000, 05/07/2002   x 3  . CHOLECYSTECTOMY    . ENDOMETRIAL ABLATION  04/30/2010  . ENDOMETRIAL ABLATION W/ NOVASURE  12/22/2006  . HYSTEROSCOPY WITH D & C  12/22/2006  . LAPAROSCOPY Right 01/23/2013   Procedure: LAPAROSCOPY OPERATIVE, Right Ovarian Cystectomy, Lysis of Adhesions;  Surgeon: Allena Katz, MD;  Location: [facility];  Service: Gynecology;  Laterality: Right;  . SHOULDER ARTHROSCOPY WITH DISTAL CLAVICLE RESECTION Right 10/03/2014   Procedure: SHOULDER ARTHROSCOPY WITH DISTAL CLAVICLE RESECTION;  Surgeon: Marchia Bond, MD;  Location: [facility];  Service: Orthopedics;  Laterality: Right;  . SHOULDER ARTHROSCOPY WITH ROTATOR CUFF REPAIR AND SUBACROMIAL DECOMPRESSION Right 10/03/2014   Procedure: RIGHT SHOULDER  ARTHROSCOPY DEBRIDEMENT,ACROMIOPLASTY,DISTAL CLAVICLE EXCISION,ROTATOR CUFF REPAIR ;  Surgeon: Marchia Bond, MD;  Location: [facility];  Service: Orthopedics;  Laterality: Right;  . TONSILLECTOMY      There were no vitals filed for this visit.    Subjective Assessment - 12/20/19 1019    Subjective Pt reports she had neck surgery in April. Was initially feeling pretty good but went she went back to work she started having pain in late June/July. Imaging revealed that 2 of the screws had come loose. She is currently using a bone stimulator at home to promote healing and hopes to avoid having to go back to surgery. Also reports h/o low back surgery in January.    Pertinent History 07/08/19 - ACDF C4-5, 5-6, 6-7; lumbar surgery 04/29/19; R RTC repair + DCR 10/03/14; R carpal tunnel release    Patient Stated Goals "better motion left & right with less pain and to feel stronger in my neck"    Currently in Pain? Yes    Pain Score 5    4-5/10   Pain Location Neck   & upper shoulders   Pain Orientation Lower    Pain Descriptors / Indicators Burning    Pain Type Acute pain    Pain Radiating Towards into B shoulders but not into arms; notes numbness in L hand at night but MD thinks it is carpal tunnel  Pain Onset More than a month ago    Pain Frequency Constant    Aggravating Factors  work - waiting tables    Pain Relieving Factors ibuprofen - tries to limit use to avoid delaying bone healing    Effect of Pain on Daily Activities difficulty turning head              [facility] PT Assessment - 12/20/19 1016      Assessment   Medical Diagnosis Cervicalgia following ACDF with hardware failure    Referring Provider (PT) Eustace Moore, MD    Next MD Visit TBD    Prior Therapy PT s/p R RCR with [facility] 2016      Precautions   Precautions Other (comment)    Precaution Comments lifting limited to 10-15#      Restrictions   Weight Bearing Restrictions No      Balance Screen   Has the  patient fallen in the past 6 months No    Has the patient had a decrease in activity level because of a fear of falling?  No    Is the patient reluctant to leave their home because of a fear of falling?  No      Home Social worker Private residence    Living Arrangements Spouse/significant other;Children    Type of [place]      Prior Function   Level of [place] Full time employment    Vocation Requirements waiting tables    Leisure cooking, baking, walking daily 1-2 miles      Cognition   Overall Cognitive Status Within Functional Limits for tasks assessed      Observation/Other Assessments   Focus on Therapeutic Outcomes (FOTO)  Neck - 57% (43% limitation); Predicted 67% (33% limitation)      Posture/Postural Control   Posture/Postural Control Postural limitations    Postural Limitations Forward head;Rounded Shoulders;Decreased lumbar lordosis      ROM / Strength   AROM / PROM / Strength AROM;Strength      AROM   AROM Assessment Site Cervical;Shoulder    Right/Left Shoulder --   B shoulder WNL   Cervical Flexion 41 - pain    Cervical Extension 44 - pain    Cervical - Right Side Bend 19 - uncomfortable    Cervical - Left Side Bend 26 - uncomfortable    Cervical - Right Rotation 45 - pain    Cervical - Left Rotation 50 - pain      Strength   Strength Assessment Site Shoulder    Right/Left Shoulder Right;Left    Right Shoulder Flexion 5/5    Right Shoulder ABduction 5/5    Right Shoulder Internal Rotation 5/5    Right Shoulder External Rotation 4+/5    Left Shoulder Flexion 5/5    Left Shoulder ABduction 5/5    Left Shoulder Internal Rotation 5/5    Left Shoulder External Rotation 4+/5      Palpation   Palpation comment ttp with increased muscle tension in B cervicothoracic paraspinals, UT, LS, rhomboids and periscapular muscles                      Objective measurements completed on examination: See  above findings.       [facility] Adult PT Treatment/Exercise - 12/20/19 1016      Exercises   Exercises Neck      Neck Exercises: Seated   Neck Retraction 10  reps;5 secs    Other Seated Exercise Scap retraction & depression 10 x 5"      Neck Exercises: Stretches   Upper Trapezius Stretch Right;Left;30 seconds;1 rep    Levator Stretch Right;Left;30 seconds;1 rep    Corner Stretch 30 seconds;3 reps    Corner Stretch Limitations 3-way doorway pec stretch    Other Neck Stretches B rhomboid stretch x 30 sec                  PT Education - 12/20/19 1100    Education Details PT eval findings, anticipated POC and initial HEP    Person(s) Educated Patient    Methods Explanation;Demonstration;Verbal cues;Handout    Comprehension Verbalized understanding;Verbal cues required;Returned demonstration;Need further instruction            PT Short Term Goals - 12/20/19 1102      PT SHORT TERM GOAL #1   Title Patient will be independent with initial HEP    Status New    Target Date 01/31/20      PT SHORT TERM GOAL #2   Status New    Target Date 01/10/20      PT SHORT TERM GOAL #3   Title Patient will verbalize/demonstrate good awareness of neutral spine posture and proper body mechanics for daily tasks    Status New    Target Date 01/10/20             PT Long Term Goals - 12/20/19 1320      PT LONG TERM GOAL #1   Title Patient will be independent with ongoing/advanced HEP    Status New    Target Date 01/31/20      PT LONG TERM GOAL #2   Title Patient to demonstrate ability to achieve and maintain good spinal alignment/posturing    Status New    Target Date 01/31/20      PT LONG TERM GOAL #3   Title Patient to report pain reduction in frequency and intensity by >/= 50%    Status New    Target Date 01/31/20      PT LONG TERM GOAL #4   Title Patient to improve cervical AROM to WFL/WNL without pain provocation    Status New    Target Date 01/31/20      PT LONG  TERM GOAL #5   Title Patient to report ability to perform ADLs, household and work-related tasks without increased pain    Status New    Target Date 01/31/20                  Plan - 12/20/19 1102    Clinical Impression Statement Sheri Evans is a 44 y/o female who presents to OP PT for lower neck pain following C4-5, 5-6, 6-7 ACDF on 07/08/19 with subsequent loosening of 2 of the screws. She reports she was told the screws are not endangering anything and she is currently using a bone stimulator to promote healing of the fusion in hopes of avoiding return to surgery. Pain onset began in late June/early July after she return to work waiting tables. Deficits include pain in lower neck and B upper shoulders; ttp and increased muscle tension in cervicothoracic paraspinals, UT, LS, rhomboids and medial/lateral periscapular muscles; forward head and rounder shoulder posture; and limited/painful cervical AROM. Pain limits her ability to turn her head and interferes with her ability to perform her job waiting tables. Sheri Evans will benefit from skilled PT to address above deficits, promote neutral spinal  posture and restore functional cervical ROM with decreased pain and muscle tightness to restore improved quality of life with decreased pain interference.    Personal Factors and Comorbidities Comorbidity 3+;Time since onset of injury/illness/exacerbation;Past/Current Experience;Profession    Comorbidities 07/08/19 - ACDF C4-5, 5-6, 6-7; lumbar surgery 04/29/19; R RTC repair + DCR 10/03/14; R carpal tunnel release; Celiac disease; migraines    Examination-Activity Limitations Caring for Others;Lift;Carry    Examination-Participation Restrictions Cleaning;Community Activity;Driving;Interpersonal Relationship;Laundry;Meal Prep;Occupation;Shop;Yard Work    [employer]chant [employer] officer Evolving/Moderate complexity    Clinical Decision Making Moderate    Rehab Potential Good    PT Frequency 2x / week    PT  Duration 6 weeks    PT Treatment/Interventions ADLs/Self Care Home Management;Cryotherapy;Electrical Stimulation;Iontophoresis 6m/ml Dexamethasone;Moist Heat;Ultrasound;Functional mobility training;Therapeutic activities;Therapeutic exercise;Neuromuscular re-education;Patient/family education;Manual techniques;Passive range of motion;Dry needling;Taping    PT Next Visit Plan Review initial HEP; posture and body mechanics education; cervicothoracic flexibility and ROM; postural strengthening; modalities PRN    PT Home Exercise Plan 9/17 - chin tuck, scap retraction/depression, UT, LS, rhomboids & doorway pec stretches    Consulted and Agree with Plan of Care Patient           Patient will benefit from skilled therapeutic intervention in order to improve the following deficits and impairments:  Decreased activity tolerance, Decreased range of motion, Decreased strength, Increased fascial restricitons, Increased muscle spasms, Impaired perceived functional ability, Impaired flexibility, Impaired sensation, Impaired UE functional use, Improper body mechanics, Postural dysfunction, Pain  Visit Diagnosis: Cervicalgia  Other muscle spasm  Abnormal posture     Problem List Patient Active Problem List   Diagnosis Date Noted  . Right rotator cuff tear 10/03/2014    JPercival Spanish PT, MPT 12/20/2019, 1:24 PM  [facility] [place] [address]Phone: [phone]  Fax:  [phone] Name: Sheri GAUSEMRN: [id number] of Birth: [birth year]

## 2019-12-20 NOTE — Patient Instructions (Signed)
    Home exercise program created by Nnamdi Dacus, PT.  For questions, please contact Coley Kulikowski via phone at [phone] or email at [email or web address]  [facility] [address]  [address] [place], [place], [address] Phone: [phone]   Fax:  [phone]    

## 2019-12-30 ENCOUNTER — Ambulatory Visit: Payer: BC Managed Care – PPO

## 2019-12-30 ENCOUNTER — Other Ambulatory Visit: Payer: Self-pay

## 2019-12-30 DIAGNOSIS — R293 Abnormal posture: Secondary | ICD-10-CM

## 2019-12-30 DIAGNOSIS — M542 Cervicalgia: Secondary | ICD-10-CM

## 2019-12-30 DIAGNOSIS — M62838 Other muscle spasm: Secondary | ICD-10-CM

## 2019-12-30 NOTE — Therapy (Signed)
[facility] [address]  [place], [place], [address] Phone: [phone]   Fax:  [phone]  Physical Therapy Treatment  Patient Details  Name: Sheri Evans MRN: [id number] Date of Birth: 11/14/1975 Referring Provider (PT): Eustace Moore, MD   Encounter Date: 12/30/2019   PT End of Session - 12/30/19 1443    Visit Number 2    Number of Visits 12    Date for PT Re-Evaluation 01/31/20    Authorization Type BCBS    PT Start Time 3846    PT Stop Time 1543    PT Time Calculation (min) 65 min    Activity Tolerance Patient tolerated treatment well    Behavior During Therapy [facility] for tasks assessed/performed           Past Medical History:  Diagnosis Date  . Bursitis of shoulder 09/2014   right  . Celiac disease   . Migraines   . 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 Ovarian cyst   . Poison ivy 09/30/2014   posterior right knee  . PONV (postoperative nausea and vomiting)   . Rotator cuff tear 09/2014   right shoulder    Past Surgical History:  Procedure Laterality Date  . ABDOMINAL HYSTERECTOMY  2013   partial  . CARPAL TUNNEL RELEASE Right   . [place], 2000, 05/07/2002   x 3  . CHOLECYSTECTOMY    . ENDOMETRIAL ABLATION  04/30/2010  . ENDOMETRIAL ABLATION W/ NOVASURE  12/22/2006  . HYSTEROSCOPY WITH D & C  12/22/2006  . LAPAROSCOPY Right 01/23/2013   Procedure: LAPAROSCOPY OPERATIVE, Right Ovarian Cystectomy, Lysis of Adhesions;  Surgeon: Allena Katz, MD;  Location: [facility];  Service: Gynecology;  Laterality: Right;  . SHOULDER ARTHROSCOPY WITH DISTAL CLAVICLE RESECTION Right 10/03/2014   Procedure: SHOULDER ARTHROSCOPY WITH DISTAL CLAVICLE RESECTION;  Surgeon: Marchia Bond, MD;  Location: [facility];  Service: Orthopedics;  Laterality: Right;  . SHOULDER ARTHROSCOPY WITH ROTATOR CUFF REPAIR AND SUBACROMIAL DECOMPRESSION Right 10/03/2014   Procedure: RIGHT SHOULDER  ARTHROSCOPY DEBRIDEMENT,ACROMIOPLASTY,DISTAL CLAVICLE EXCISION,ROTATOR CUFF REPAIR ;  Surgeon: Marchia Bond, MD;  Location: [facility];  Service: Orthopedics;  Laterality: Right;  . TONSILLECTOMY      There were no vitals filed for this visit.   Subjective Assessment - 12/30/19 1440    Subjective Pt. notes some problems with performing chin tuck exercise at home due to increased pain.    Pertinent History 07/08/19 - ACDF C4-5, 5-6, 6-7; lumbar surgery 04/29/19; R RTC repair + DCR 10/03/14; R carpal tunnel release    Patient Stated Goals "better motion left & right with less pain and to feel stronger in my neck"    Currently in Pain? Yes    Pain Score 5     Pain Location Neck    Pain Orientation Lower    Pain Descriptors / Indicators Burning   " muscle pulling "   Pain Type Acute pain    Pain Radiating Towards pain radiating into B shoulders; denies pain into arms or hands    Pain Onset More than a month ago    Pain Frequency Intermittent    Aggravating Factors  turning head    Multiple Pain Sites No                             [facility] Adult PT Treatment/Exercise - 12/30/19  0001      Neck Exercises: Machines for Strengthening   UBE (Upper Arm Bike) Lvl 1.0, 75mn forwards, 3 backwards     Cybex Row 5# low row x 15       Neck Exercises: Theraband   Shoulder Extension 10 reps;Red    Shoulder Extension Limitations cues for full scapular retraction     Rows 10 reps;Red    Rows Limitations cues for full scapular retraction       Neck Exercises: Standing   Other Standing Exercises standing gentle wall pushup + x 10      Modalities   Modalities Electrical Stimulation;Moist Heat      Moist Heat Therapy   Number Minutes Moist Heat 15 Minutes    Moist Heat Location Cervical      Electrical Stimulation   Electrical Stimulation Location R-sided cervical spine     Electrical Stimulation Action IFC    Electrical Stimulation Parameters 80-150Hz , intensity to  pt. tolerance, 15'    Electrical Stimulation Goals Pain      Manual Therapy   Manual Therapy Soft tissue mobilization    Manual therapy comments seated     Soft tissue mobilization STM/DTM to B UT, LS      Neck Exercises: Stretches   Upper Trapezius Stretch Right;Left;30 seconds;1 rep    Levator Stretch Right;Left;30 seconds;1 rep    CWarehouse manager30 seconds;3 reps    Corner Stretch Limitations 3-way doorway pec stretch    Chest Stretch 1 rep;30 seconds    Chest Stretch Limitations B single arm mid position on doorway     Other Neck Stretches B rhomboid stretch x 30 sec                  PT Education - 12/30/19 1817    Education Details HEP update row and row extension with red TB    Person(s) Educated Patient    Methods Explanation;Demonstration;Verbal cues;Handout    Comprehension Verbalized understanding;Returned demonstration;Verbal cues required            PT Short Term Goals - 12/30/19 1443      PT SHORT TERM GOAL #1   Title Patient will be independent with initial HEP    Status On-going    Target Date 01/31/20      PT SHORT TERM GOAL #2   Status On-going    Target Date 01/10/20      PT SHORT TERM GOAL #3   Title Patient will verbalize/demonstrate good awareness of neutral spine posture and proper body mechanics for daily tasks    Status On-going    Target Date 01/10/20             PT Long Term Goals - 12/30/19 1444      PT LONG TERM GOAL #1   Title Patient will be independent with ongoing/advanced HEP    Status On-going      PT LONG TERM GOAL #2   Title Patient to demonstrate ability to achieve and maintain good spinal alignment/posturing    Status On-going      PT LONG TERM GOAL #3   Title Patient to report pain reduction in frequency and intensity by >/= 50%    Status On-going      PT LONG TERM GOAL #4   Title Patient to improve cervical AROM to WFL/WNL without pain provocation    Status On-going      PT LONG TERM GOAL #5   Title  Patient to report ability  to perform ADLs, household and work-related tasks without increased pain    Status On-going                 Plan - 12/30/19 1444    Clinical Impression Statement Sheri Evans doing ok.  Notes some increased pain with chin tuck HEP which improved after review and correction to avoid painful end ROM.  Progressed anterior chest stretching along with cervical/upper shoulder stretching without pain.  Tolerated initiation of periscapular strengthening for postural support well thus updated HEP accordingly (see pt. education section).  Ended visit with trial of E-stim/moist heat to B upper shoulders/cervical for pain relief of R-sided neck pain.    Comorbidities 07/08/19 - ACDF C4-5, 5-6, 6-7; lumbar surgery 04/29/19; R RTC repair + DCR 10/03/14; R carpal tunnel release; Celiac disease; migraines    Rehab Potential Good    PT Frequency 2x / week    PT Duration 6 weeks    PT Treatment/Interventions ADLs/Self Care Home Management;Cryotherapy;Electrical Stimulation;Iontophoresis 42m/ml Dexamethasone;Moist Heat;Ultrasound;Functional mobility training;Therapeutic activities;Therapeutic exercise;Neuromuscular re-education;Patient/family education;Manual techniques;Passive range of motion;Dry needling;Taping    PT Next Visit Plan Review initial HEP; posture and body mechanics education; cervicothoracic flexibility and ROM; postural strengthening; modalities PRN    PT Home Exercise Plan 9/17 - chin tuck, scap retraction/depression, UT, LS, rhomboids & doorway pec stretches    Consulted and Agree with Plan of Care Patient           Patient will benefit from skilled therapeutic intervention in order to improve the following deficits and impairments:  Decreased activity tolerance, Decreased range of motion, Decreased strength, Increased fascial restricitons, Increased muscle spasms, Impaired perceived functional ability, Impaired flexibility, Impaired sensation, Impaired UE functional use,  Improper body mechanics, Postural dysfunction, Pain  Visit Diagnosis: Cervicalgia  Other muscle spasm  Abnormal posture     Problem List Patient Active Problem List   Diagnosis Date Noted  . Right rotator cuff tear 10/03/2014    MBess Harvest PTA 12/30/19 6:22 PM   [facility] [address] [place] [address]Phone: [phone]  Fax:  [phone] Name: Sheri CURVINMRN: [id number] of Birth: [birth year]

## 2020-01-01 ENCOUNTER — Encounter: Payer: Self-pay | Admitting: Physical Therapy

## 2020-01-01 ENCOUNTER — Ambulatory Visit: Payer: BC Managed Care – PPO | Admitting: Physical Therapy

## 2020-01-01 ENCOUNTER — Other Ambulatory Visit: Payer: Self-pay

## 2020-01-01 DIAGNOSIS — M542 Cervicalgia: Secondary | ICD-10-CM | POA: Diagnosis not present

## 2020-01-01 DIAGNOSIS — R293 Abnormal posture: Secondary | ICD-10-CM

## 2020-01-01 DIAGNOSIS — M62838 Other muscle spasm: Secondary | ICD-10-CM

## 2020-01-01 NOTE — Patient Instructions (Signed)

## 2020-01-01 NOTE — Therapy (Signed)
[facility] [address]  [place], [place], [address] Phone: [phone]   Fax:  [phone]  Physical Therapy Treatment  Patient Details  Name: Sheri Evans MRN: [id number] Date of Birth: 01-26-1976 Referring Provider (PT): Eustace Moore, MD   Encounter Date: 01/01/2020   PT End of Session - 01/01/20 1018    Visit Number 3    Number of Visits 12    Date for PT Re-Evaluation 01/31/20    Authorization Type BCBS    PT Start Time 1018    PT Stop Time 1118    PT Time Calculation (min) 60 min    Activity Tolerance Patient tolerated treatment well    Behavior During Therapy [facility] for tasks assessed/performed           Past Medical History:  Diagnosis Date  . Bursitis of shoulder 09/2014   right  . Celiac disease   . Migraines   . 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 Ovarian cyst   . Poison ivy 09/30/2014   posterior right knee  . PONV (postoperative nausea and vomiting)   . Rotator cuff tear 09/2014   right shoulder    Past Surgical History:  Procedure Laterality Date  . ABDOMINAL HYSTERECTOMY  2013   partial  . CARPAL TUNNEL RELEASE Right   . [place], 2000, 05/07/2002   x 3  . CHOLECYSTECTOMY    . ENDOMETRIAL ABLATION  04/30/2010  . ENDOMETRIAL ABLATION W/ NOVASURE  12/22/2006  . HYSTEROSCOPY WITH D & C  12/22/2006  . LAPAROSCOPY Right 01/23/2013   Procedure: LAPAROSCOPY OPERATIVE, Right Ovarian Cystectomy, Lysis of Adhesions;  Surgeon: Allena Katz, MD;  Location: [facility];  Service: Gynecology;  Laterality: Right;  . SHOULDER ARTHROSCOPY WITH DISTAL CLAVICLE RESECTION Right 10/03/2014   Procedure: SHOULDER ARTHROSCOPY WITH DISTAL CLAVICLE RESECTION;  Surgeon: Marchia Bond, MD;  Location: [facility];  Service: Orthopedics;  Laterality: Right;  . SHOULDER ARTHROSCOPY WITH ROTATOR CUFF REPAIR AND SUBACROMIAL DECOMPRESSION Right 10/03/2014   Procedure: RIGHT SHOULDER  ARTHROSCOPY DEBRIDEMENT,ACROMIOPLASTY,DISTAL CLAVICLE EXCISION,ROTATOR CUFF REPAIR ;  Surgeon: Marchia Bond, MD;  Location: [facility];  Service: Orthopedics;  Laterality: Right;  . TONSILLECTOMY      There were no vitals filed for this visit.   Subjective Assessment - 01/01/20 1022    Subjective Pt reports chin tuck from HEP feels better after review last visit -  no further issues with HEP at this time.    Pertinent History 07/08/19 - ACDF C4-5, 5-6, 6-7; lumbar surgery 04/29/19; R RTC repair + DCR 10/03/14; R carpal tunnel release    Patient Stated Goals "better motion left & right with less pain and to feel stronger in my neck"    Currently in Pain? No/denies    Pain Onset More than a month ago                             [facility] Adult PT Treatment/Exercise - 01/01/20 1018      Self-Care   Self-Care Posture    Posture Provided education in proper posture and body mechanics to minimize strain on back and cervical spine with typical daily positioning and houshold tasks      Exercises   Exercises Neck      Neck Exercises: Machines for Strengthening   UBE (Upper Arm Bike) L2.0 x 6 min (3' fwd/3'  back)      Neck Exercises: Theraband   Shoulder Extension 10 reps;Red    Shoulder Extension Limitations cues for sscap retraction and depression, tucking elbows into ribs with forearms parallel to floor    Rows 10 reps;Red    Rows Limitations cues for sscap retraction and depression    Shoulder External Rotation 10 reps;Red    Shoulder External Rotation Limitations + scap retraction; hook lying on towel roll     Horizontal ABduction 10 reps;Red    Horizontal ABduction Limitations + scap retraction; hook lying on towel roll     Other Theraband Exercises Alt red TB UE diagonals + scap retraction 10 x 3"; hook lying on towel roll       Neck Exercises: Prone   Neck Retraction 10 reps;3 secs    Neck Retraction Limitations POE      Modalities   Modalities  Electrical Stimulation;Moist Heat      Moist Heat Therapy   Number Minutes Moist Heat 15 Minutes    Moist Heat Location Cervical      Electrical Stimulation   Electrical Stimulation Location B cervical paraspinals/UT    Electrical Stimulation Action IFC    Electrical Stimulation Parameters 80-_0 , intensity to pt tolerance x 15'                  PT Education - 01/01/20 1026    Education Details Posture and body mechanics education for typical daily tasks    Person(s) Educated Patient    Methods Explanation;Demonstration;Verbal cues    Comprehension Verbalized understanding            PT Short Term Goals - 01/01/20 1024      PT SHORT TERM GOAL #1   Title Patient will be independent with initial HEP    Status Achieved   01/01/20     PT SHORT TERM GOAL #2   Title Patient will verbalize/demonstrate good awareness of neutral spine posture and proper body mechanics for daily tasks    Status On-going    Target Date 01/10/20             PT Long Term Goals - 01/01/20 1024      PT LONG TERM GOAL #1   Title Patient will be independent with ongoing/advanced HEP    Status On-going    Target Date 01/31/20      PT LONG TERM GOAL #2   Title Patient to demonstrate ability to achieve and maintain good spinal alignment/posturing    Status On-going    Target Date 01/31/20      PT LONG TERM GOAL #3   Title Patient to report pain reduction in frequency and intensity by >/= 50%    Status On-going    Target Date 01/31/20      PT LONG TERM GOAL #4   Title Patient to improve cervical AROM to WFL/WNL without pain provocation    Status On-going    Target Date 01/31/20      PT LONG TERM GOAL #5   Title Patient to report ability to perform ADLs, household and work-related tasks without increased pain    Status On-going    Target Date 01/31/20                 Plan - 01/01/20 1025    Clinical Impression Statement Ming reports improved tolerance for chin tucks  following HEP review last visit with no further issues noted with HEP - STG #1 met. Provided education in  proper posture and body mechanics for typical daily tasks to minimize back and neck strain with everyday activities with pt acknowledging good understanding. Latest HEP update reviewed with cues to avoid excessive motions emphasizing expectation of scapular engagement more than stretch - pt able to perform good return demonstration following review. Progressed postural strengthening with gravity resisted cervical retraction along with continued emphasis on scapular retraction with shoulder motions using red TB - pt noting red TB not too difficult, therefore may benefit from progression to green TB or upright positioning in future visits.    Comorbidities 07/08/19 - ACDF C4-5, 5-6, 6-7; lumbar surgery 04/29/19; R RTC repair + DCR 10/03/14; R carpal tunnel release; Celiac disease; migraines    Rehab Potential Good    PT Frequency 2x / week    PT Duration 6 weeks    PT Treatment/Interventions ADLs/Self Care Home Management;Cryotherapy;Electrical Stimulation;Iontophoresis 65m/ml Dexamethasone;Moist Heat;Ultrasound;Functional mobility training;Therapeutic activities;Therapeutic exercise;Neuromuscular re-education;Patient/family education;Manual techniques;Passive range of motion;Dry needling;Taping    PT Next Visit Plan cervicothoracic flexibility and ROM; postural strengthening; modalities PRN; posture and body mechanics review PRN    PT Home Exercise Plan 9/17 - chin tuck, scap retraction/depression, UT, LS, rhomboids & doorway pec stretches    Consulted and Agree with Plan of Care Patient           Patient will benefit from skilled therapeutic intervention in order to improve the following deficits and impairments:  Decreased activity tolerance, Decreased range of motion, Decreased strength, Increased fascial restricitons, Increased muscle spasms, Impaired perceived functional ability, Impaired  flexibility, Impaired sensation, Impaired UE functional use, Improper body mechanics, Postural dysfunction, Pain  Visit Diagnosis: Cervicalgia  Other muscle spasm  Abnormal posture     Problem List Patient Active Problem List   Diagnosis Date Noted  . Right rotator cuff tear 10/03/2014    JPercival Spanish PT, MPT 01/01/2020, 11:21 AM  [facility] [place] [address]Phone: [phone]  Fax:  [phone] Name: SSHAWANA KNOCHMRN: [id number] of Birth: [birth year]

## 2020-01-06 ENCOUNTER — Ambulatory Visit: Payer: BC Managed Care – PPO

## 2020-01-08 ENCOUNTER — Other Ambulatory Visit: Payer: Self-pay

## 2020-01-08 ENCOUNTER — Ambulatory Visit: Payer: BC Managed Care – PPO | Attending: Neurological Surgery

## 2020-01-08 DIAGNOSIS — M542 Cervicalgia: Secondary | ICD-10-CM | POA: Insufficient documentation

## 2020-01-08 DIAGNOSIS — R293 Abnormal posture: Secondary | ICD-10-CM | POA: Insufficient documentation

## 2020-01-08 DIAGNOSIS — M62838 Other muscle spasm: Secondary | ICD-10-CM | POA: Diagnosis present

## 2020-01-08 NOTE — Therapy (Signed)
[facility] [address]  [place], [place], [address] Phone: [phone]   Fax:  [phone]  Physical Therapy Treatment  Patient Details  Name: Sheri Evans MRN: [id number] Date of Birth: January 03, 1976 Referring Provider (PT): Eustace Moore, MD   Encounter Date: 01/08/2020   PT End of Session - 01/08/20 1027    Visit Number 4    Number of Visits 12    Date for PT Re-Evaluation 01/31/20    Authorization Type BCBS    PT Start Time 1019    PT Stop Time 1130    PT Time Calculation (min) 71 min    Activity Tolerance Patient tolerated treatment well    Behavior During Therapy [facility] for tasks assessed/performed           Past Medical History:  Diagnosis Date  . Bursitis of shoulder 09/2014   right  . Celiac disease   . Migraines   . 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 Ovarian cyst   . Poison ivy 09/30/2014   posterior right knee  . PONV (postoperative nausea and vomiting)   . Rotator cuff tear 09/2014   right shoulder    Past Surgical History:  Procedure Laterality Date  . ABDOMINAL HYSTERECTOMY  2013   partial  . CARPAL TUNNEL RELEASE Right   . [place], 2000, 05/07/2002   x 3  . CHOLECYSTECTOMY    . ENDOMETRIAL ABLATION  04/30/2010  . ENDOMETRIAL ABLATION W/ NOVASURE  12/22/2006  . HYSTEROSCOPY WITH D & C  12/22/2006  . LAPAROSCOPY Right 01/23/2013   Procedure: LAPAROSCOPY OPERATIVE, Right Ovarian Cystectomy, Lysis of Adhesions;  Surgeon: Allena Katz, MD;  Location: [facility];  Service: Gynecology;  Laterality: Right;  . SHOULDER ARTHROSCOPY WITH DISTAL CLAVICLE RESECTION Right 10/03/2014   Procedure: SHOULDER ARTHROSCOPY WITH DISTAL CLAVICLE RESECTION;  Surgeon: Marchia Bond, MD;  Location: [facility];  Service: Orthopedics;  Laterality: Right;  . SHOULDER ARTHROSCOPY WITH ROTATOR CUFF REPAIR AND SUBACROMIAL DECOMPRESSION Right 10/03/2014   Procedure: RIGHT SHOULDER  ARTHROSCOPY DEBRIDEMENT,ACROMIOPLASTY,DISTAL CLAVICLE EXCISION,ROTATOR CUFF REPAIR ;  Surgeon: Marchia Bond, MD;  Location: [facility];  Service: Orthopedics;  Laterality: Right;  . TONSILLECTOMY      There were no vitals filed for this visit.   Subjective Assessment - 01/08/20 1028    Subjective Notes her neck is hurting more due to working a lot.    Pertinent History 07/08/19 - ACDF C4-5, 5-6, 6-7; lumbar surgery 04/29/19; R RTC repair + DCR 10/03/14; R carpal tunnel release    Patient Stated Goals "better motion left & right with less pain and to feel stronger in my neck"    Currently in Pain? Yes    Pain Score 4     Pain Location Neck    Pain Orientation Lower    Pain Descriptors / Indicators Discomfort                             [facility] Adult PT Treatment/Exercise - 01/08/20 0001      Neck Exercises: Machines for Strengthening   UBE (Upper Arm Bike) L2.0 x 6 min (3' fwd/3' back)    Cybex Row 15# low row x 15       Neck Exercises: Theraband   Rows 10 reps;Red    Rows Limitations cues for sscap retraction and depression    Shoulder External  Rotation 10 reps;Red    Shoulder External Rotation Limitations + scap retraction; hook lying on towel roll     Horizontal ABduction Red;15 reps    Horizontal ABduction Limitations + scap retraction; hook lying on towel roll     Other Theraband Exercises Alt red TB UE diagonals + scap retraction 15 x 3"; hook lying on towel roll       Neck Exercises: Standing   Other Standing Exercises Standing B external rotation leaning on doorway 3" x 10       Neck Exercises: Seated   Other Seated Exercise Cervical extension SNAGs 5" x 10       Neck Exercises: Supine   Other Supine Exercise Protraction 4# x 15 reps       Moist Heat Therapy   Number Minutes Moist Heat 15 Minutes    Moist Heat Location Cervical      Electrical Stimulation   Electrical Stimulation Location B cervical paraspinals/UT    Electrical  Stimulation Action IFC    Electrical Stimulation Parameters 80-150Hz , intensity to pt. tolerance, 15'    Electrical Stimulation Goals Pain      Manual Therapy   Manual Therapy Soft tissue mobilization;Myofascial release    Manual therapy comments seated     Soft tissue mobilization STM/DTM to B UT, LS, scalenes, cervical paraspinals     Myofascial Release TPR to R scalenes, R LS      Neck Exercises: Stretches   Upper Trapezius Stretch Right;Left;30 seconds;1 rep    Levator Stretch Right;Left;30 seconds;1 rep    Other Neck Stretches B rhomboid stretch holding machine  x 30 sec                    PT Short Term Goals - 01/08/20 1148      PT SHORT TERM GOAL #1   Title Patient will be independent with initial HEP    Status Achieved   01/01/20     PT SHORT TERM GOAL #2   Title Patient will verbalize/demonstrate good awareness of neutral spine posture and proper body mechanics for daily tasks    Status Achieved    Target Date 01/10/20             PT Long Term Goals - 01/01/20 1024      PT LONG TERM GOAL #1   Title Patient will be independent with ongoing/advanced HEP    Status On-going    Target Date 01/31/20      PT LONG TERM GOAL #2   Title Patient to demonstrate ability to achieve and maintain good spinal alignment/posturing    Status On-going    Target Date 01/31/20      PT LONG TERM GOAL #3   Title Patient to report pain reduction in frequency and intensity by >/= 50%    Status On-going    Target Date 01/31/20      PT LONG TERM GOAL #4   Title Patient to improve cervical AROM to WFL/WNL without pain provocation    Status On-going    Target Date 01/31/20      PT LONG TERM GOAL #5   Title Patient to report ability to perform ADLs, household and work-related tasks without increased pain    Status On-going    Target Date 01/31/20                 Plan - 01/08/20 1034    Clinical Impression Statement Pt. noting she has been working longer shifts  ~  12 hours at a time and feels this has led to increased neck pain.  Does report improved awareness of posture since starting therapy.  STG #2 achieved.  Progressed periscapular strengthening without issue today with green TB issued to pt. for banded row in door.  MT focused on improving tissue quality in B upper shoulder and neck musculature with TPR to R Scalenes and R LS musculature with good response.  Ended visit with E-stim/moist heat to cervical spine for reduction in pain and tension as pt. noting lasting relief from this modality in previous visits.    Comorbidities 07/08/19 - ACDF C4-5, 5-6, 6-7; lumbar surgery 04/29/19; R RTC repair + DCR 10/03/14; R carpal tunnel release; Celiac disease; migraines    Rehab Potential Good    PT Frequency 2x / week    PT Duration 6 weeks    PT Treatment/Interventions ADLs/Self Care Home Management;Cryotherapy;Electrical Stimulation;Iontophoresis 65m/ml Dexamethasone;Moist Heat;Ultrasound;Functional mobility training;Therapeutic activities;Therapeutic exercise;Neuromuscular re-education;Patient/family education;Manual techniques;Passive range of motion;Dry needling;Taping    PT Next Visit Plan cervicothoracic flexibility and ROM; postural strengthening; modalities PRN; posture and body mechanics review PRN    PT Home Exercise Plan 9/17 - chin tuck, scap retraction/depression, UT, LS, rhomboids & doorway pec stretches; 09/27 - red TB row and extension    Consulted and Agree with Plan of Care Patient           Patient will benefit from skilled therapeutic intervention in order to improve the following deficits and impairments:  Decreased activity tolerance, Decreased range of motion, Decreased strength, Increased fascial restricitons, Increased muscle spasms, Impaired perceived functional ability, Impaired flexibility, Impaired sensation, Impaired UE functional use, Improper body mechanics, Postural dysfunction, Pain  Visit Diagnosis: Cervicalgia  Other muscle  spasm  Abnormal posture     Problem List Patient Active Problem List   Diagnosis Date Noted  . Right rotator cuff tear 10/03/2014    MBess Harvest PTA 01/08/20 12:13 PM   [facility] [address] [place] [address]Phone: [phone]  Fax:  [phone] Name: SCHALICE PHILBERTMRN: [id number] of Birth: 3April 10, 1977

## 2020-01-13 ENCOUNTER — Ambulatory Visit: Payer: BC Managed Care – PPO

## 2020-01-13 ENCOUNTER — Other Ambulatory Visit: Payer: Self-pay

## 2020-01-13 DIAGNOSIS — M62838 Other muscle spasm: Secondary | ICD-10-CM

## 2020-01-13 DIAGNOSIS — R293 Abnormal posture: Secondary | ICD-10-CM

## 2020-01-13 DIAGNOSIS — M542 Cervicalgia: Secondary | ICD-10-CM | POA: Diagnosis not present

## 2020-01-13 NOTE — Therapy (Signed)
[facility] [address]  [place], [place], [address] Phone: [phone]   Fax:  [phone]  Physical Therapy Treatment  Patient Details  Name: Sheri Evans MRN: [id number] Date of Birth: May 15, 1975 Referring Provider (PT): Eustace Moore, MD   Encounter Date: 01/13/2020   PT End of Session - 01/13/20 1027    Visit Number 5    Number of Visits 12    Date for PT Re-Evaluation 01/31/20    Authorization Type BCBS    PT Start Time 1022    PT Stop Time 1115    PT Time Calculation (min) 53 min    Activity Tolerance Patient tolerated treatment well    Behavior During Therapy [facility] for tasks assessed/performed           Past Medical History:  Diagnosis Date  . Bursitis of shoulder 09/2014   right  . Celiac disease   . Migraines   . 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 Ovarian cyst   . Poison ivy 09/30/2014   posterior right knee  . PONV (postoperative nausea and vomiting)   . Rotator cuff tear 09/2014   right shoulder    Past Surgical History:  Procedure Laterality Date  . ABDOMINAL HYSTERECTOMY  2013   partial  . CARPAL TUNNEL RELEASE Right   . [place], 2000, 05/07/2002   x 3  . CHOLECYSTECTOMY    . ENDOMETRIAL ABLATION  04/30/2010  . ENDOMETRIAL ABLATION W/ NOVASURE  12/22/2006  . HYSTEROSCOPY WITH D & C  12/22/2006  . LAPAROSCOPY Right 01/23/2013   Procedure: LAPAROSCOPY OPERATIVE, Right Ovarian Cystectomy, Lysis of Adhesions;  Surgeon: Allena Katz, MD;  Location: [facility];  Service: Gynecology;  Laterality: Right;  . SHOULDER ARTHROSCOPY WITH DISTAL CLAVICLE RESECTION Right 10/03/2014   Procedure: SHOULDER ARTHROSCOPY WITH DISTAL CLAVICLE RESECTION;  Surgeon: Marchia Bond, MD;  Location: [facility];  Service: Orthopedics;  Laterality: Right;  . SHOULDER ARTHROSCOPY WITH ROTATOR CUFF REPAIR AND SUBACROMIAL DECOMPRESSION Right 10/03/2014   Procedure: RIGHT SHOULDER  ARTHROSCOPY DEBRIDEMENT,ACROMIOPLASTY,DISTAL CLAVICLE EXCISION,ROTATOR CUFF REPAIR ;  Surgeon: Marchia Bond, MD;  Location: [facility];  Service: Orthopedics;  Laterality: Right;  . TONSILLECTOMY      There were no vitals filed for this visit.   Subjective Assessment - 01/13/20 1024    Subjective has been noting some "shooters" when touching over spine at C7 and feels she needs to speak with the doctor about this.    Pertinent History 07/08/19 - ACDF C4-5, 5-6, 6-7; lumbar surgery 04/29/19; R RTC repair + DCR 10/03/14; R carpal tunnel release    Patient Stated Goals "better motion left & right with less pain and to feel stronger in my neck"    Currently in Pain? Yes    Pain Score 3     Pain Location Shoulder    Pain Orientation Upper    Pain Descriptors / Indicators Sore    Pain Type Acute pain    Pain Radiating Towards occasional shooting pain from central neck "C7" triggered by touch in this area "shooting" to R upper shoulder    Pain Onset More than a month ago    Pain Frequency Intermittent    Multiple Pain Sites No              [facility] PT Assessment - 01/13/20 0001      AROM   AROM Assessment Site Cervical  Cervical Flexion 45 - pain on returning motion     Cervical Extension 60 - pain on returning motion     Cervical - Right Side Bend 25    Cervical - Left Side Bend 26 - uncomfortable    Cervical - Right Rotation 48     Cervical - Left Rotation 55 - tightness R-sided neck                         [facility] Adult PT Treatment/Exercise - 01/13/20 0001      Neck Exercises: Theraband   Shoulder External Rotation 10 reps;Red    Shoulder External Rotation Limitations seated with pool noodle     Horizontal ABduction Red;15 reps    Horizontal ABduction Limitations seated with pool noodle     Other Theraband Exercises seated with pool noodle Alt red TB UE diagonals + scap retraction 10      Neck Exercises: Seated   Other Seated Exercise Cervical  extension SNAGs 5" x 10       Shoulder Exercises: Pulleys   Flexion 3 minutes   as UBE warmup unavailable   Scaption 3 minutes   as UBE warmup unavailable     Electrical Stimulation   Electrical Stimulation Location B cervical paraspinals/UT    Electrical Stimulation Action IFC    Electrical Stimulation Parameters 80-_0 , intensity to pt. tolerance, 15'    Electrical Stimulation Goals Pain      Neck Exercises: Stretches   Upper Trapezius Stretch Right;Left;30 seconds;1 rep    Levator Stretch Right;Left;30 seconds;1 rep    Other Neck Stretches B scalenes stretch x 30 sec B                     PT Short Term Goals - 01/08/20 1148      PT SHORT TERM GOAL #1   Title Patient will be independent with initial HEP    Status Achieved   01/01/20     PT SHORT TERM GOAL #2   Title Patient will verbalize/demonstrate good awareness of neutral spine posture and proper body mechanics for daily tasks    Status Achieved    Target Date 01/10/20             PT Long Term Goals - 01/13/20 1029      PT LONG TERM GOAL #1   Title Patient will be independent with ongoing/advanced HEP    Status On-going      PT LONG TERM GOAL #2   Title Patient to demonstrate ability to achieve and maintain good spinal alignment/posturing    Status On-going      PT LONG TERM GOAL #3   Title Patient to report pain reduction in frequency and intensity by >/= 50%    Status Achieved      PT LONG TERM GOAL #4   Title Patient to improve cervical AROM to WFL/WNL without pain provocation    Status Partially Met   01/13/20: achieved for cervical AROM flexion, extension however still with pain provocation     PT LONG TERM GOAL #5   Title Patient to report ability to perform ADLs, household and work-related tasks without increased pain    Status On-going   10/11: Has altered body mechanics with less pain at neck with laundry since starting therapy.                Plan - 01/13/20 1028     Clinical Impression Statement Pt.  noting 60% improvement in pain since starting therapy.  LTG #3 achieved.  Pt. progressing toward LTG #4 with cervical AROM improving with reduced pain at end ROM (see flowsheet).  Pt. noting improved tolerance for doing laundry since starting therapy.  Progressing toward LTG #5.  Session focused on cervical/upper shoulder flexibility exercises along with postural strengthening with no increase in pain reported.  Pt. progressing well toward LTGs.    Comorbidities 07/08/19 - ACDF C4-5, 5-6, 6-7; lumbar surgery 04/29/19; R RTC repair + DCR 10/03/14; R carpal tunnel release; Celiac disease; migraines    Rehab Potential Good    PT Frequency 2x / week    PT Duration 6 weeks    PT Treatment/Interventions ADLs/Self Care Home Management;Cryotherapy;Electrical Stimulation;Iontophoresis 6m/ml Dexamethasone;Moist Heat;Ultrasound;Functional mobility training;Therapeutic activities;Therapeutic exercise;Neuromuscular re-education;Patient/family education;Manual techniques;Passive range of motion;Dry needling;Taping    PT Next Visit Plan cervicothoracic flexibility and ROM; postural strengthening; modalities PRN; posture and body mechanics review PRN    PT Home Exercise Plan 9/17 - chin tuck, scap retraction/depression, UT, LS, rhomboids & doorway pec stretches; 09/27 - red TB row and extension    Consulted and Agree with Plan of Care Patient           Patient will benefit from skilled therapeutic intervention in order to improve the following deficits and impairments:  Decreased activity tolerance, Decreased range of motion, Decreased strength, Increased fascial restricitons, Increased muscle spasms, Impaired perceived functional ability, Impaired flexibility, Impaired sensation, Impaired UE functional use, Improper body mechanics, Postural dysfunction, Pain  Visit Diagnosis: Cervicalgia  Other muscle spasm  Abnormal posture     Problem List Patient Active Problem List    Diagnosis Date Noted  . Right rotator cuff tear 10/03/2014    MBess Harvest PTA 01/13/20 12:29 PM   [facility] [address] [place] [address]Phone: [phone]  Fax:  [phone] Name: SROBERTO HLAVATYMRN: [id number] of Birth: 304/29/1977

## 2020-01-15 ENCOUNTER — Ambulatory Visit: Payer: BC Managed Care – PPO | Admitting: Physical Therapy

## 2020-01-20 ENCOUNTER — Other Ambulatory Visit: Payer: Self-pay

## 2020-01-20 ENCOUNTER — Ambulatory Visit: Payer: BC Managed Care – PPO

## 2020-01-20 DIAGNOSIS — M62838 Other muscle spasm: Secondary | ICD-10-CM

## 2020-01-20 DIAGNOSIS — M542 Cervicalgia: Secondary | ICD-10-CM | POA: Diagnosis not present

## 2020-01-20 DIAGNOSIS — R293 Abnormal posture: Secondary | ICD-10-CM

## 2020-01-20 NOTE — Therapy (Signed)
[facility] [address]  [place], [place], [address] Phone: [phone]   Fax:  [phone]  Physical Therapy Treatment  Patient Details  Name: Sheri Evans MRN: [id number] Date of Birth: 1975/11/02 Referring Provider (PT): Eustace Moore, MD   Encounter Date: 01/20/2020   PT End of Session - 01/20/20 1026    Visit Number 6    Number of Visits 12    Date for PT Re-Evaluation 01/31/20    Authorization Type BCBS    PT Start Time 1017    PT Stop Time 1100    PT Time Calculation (min) 43 min    Activity Tolerance Patient tolerated treatment well    Behavior During Therapy [facility] for tasks assessed/performed           Past Medical History:  Diagnosis Date  . Bursitis of shoulder 09/2014   right  . Celiac disease   . Migraines   . 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 Ovarian cyst   . Poison ivy 09/30/2014   posterior right knee  . PONV (postoperative nausea and vomiting)   . Rotator cuff tear 09/2014   right shoulder    Past Surgical History:  Procedure Laterality Date  . ABDOMINAL HYSTERECTOMY  2013   partial  . CARPAL TUNNEL RELEASE Right   . [place], 2000, 05/07/2002   x 3  . CHOLECYSTECTOMY    . ENDOMETRIAL ABLATION  04/30/2010  . ENDOMETRIAL ABLATION W/ NOVASURE  12/22/2006  . HYSTEROSCOPY WITH D & C  12/22/2006  . LAPAROSCOPY Right 01/23/2013   Procedure: LAPAROSCOPY OPERATIVE, Right Ovarian Cystectomy, Lysis of Adhesions;  Surgeon: Allena Katz, MD;  Location: [facility];  Service: Gynecology;  Laterality: Right;  . SHOULDER ARTHROSCOPY WITH DISTAL CLAVICLE RESECTION Right 10/03/2014   Procedure: SHOULDER ARTHROSCOPY WITH DISTAL CLAVICLE RESECTION;  Surgeon: Marchia Bond, MD;  Location: [facility];  Service: Orthopedics;  Laterality: Right;  . SHOULDER ARTHROSCOPY WITH ROTATOR CUFF REPAIR AND SUBACROMIAL DECOMPRESSION Right 10/03/2014   Procedure: RIGHT SHOULDER  ARTHROSCOPY DEBRIDEMENT,ACROMIOPLASTY,DISTAL CLAVICLE EXCISION,ROTATOR CUFF REPAIR ;  Surgeon: Marchia Bond, MD;  Location: [facility];  Service: Orthopedics;  Laterality: Right;  . TONSILLECTOMY      There were no vitals filed for this visit.   Subjective Assessment - 01/20/20 1023    Subjective Pt. reporting "tightness" at central cervical spine is getting better with PT.    Pertinent History 07/08/19 - ACDF C4-5, 5-6, 6-7; lumbar surgery 04/29/19; R RTC repair + DCR 10/03/14; R carpal tunnel release    Patient Stated Goals "better motion left & right with less pain and to feel stronger in my neck"    Currently in Pain? No/denies    Pain Score 0-No pain   "tightness" up to 7/10 at end of work shift   Pain Location Neck    Pain Orientation Upper    Pain Descriptors / Indicators Tightness    Pain Type Acute pain    Pain Onset More than a month ago                             [facility] Adult PT Treatment/Exercise - 01/20/20 0001      Neck Exercises: Machines for Strengthening   UBE (Upper Arm Bike) --    Nustep Lvl 4, 6 min (UE/LE)    Cybex Row 15# low cable row  x 15     Lat Pull standings lats row 20# x 10    Other Machines for Strengthening Wall pushups with elbows by sides x 15   cues for scapular protraction      Neck Exercises: Theraband   Shoulder External Rotation 10 reps;Red    Shoulder External Rotation Limitations standing 90/90 "W" row    Horizontal ABduction 10 reps;Red    Horizontal ABduction Limitations standing - cues for scapular retraction       Neck Exercises: Prone   Other Prone Exercise Prone thoracic extension + "I"x 10 on edge of mat table       Neck Exercises: Stretches   Upper Trapezius Stretch Right;Left;30 seconds;1 rep    Levator Stretch Right;Left;30 seconds;1 rep    Corner Stretch 30 seconds;3 reps    Corner Stretch Limitations 3-way doorway pec stretch                  PT Education - 01/20/20 1203     Education Details HEP update;    Person(s) Educated Patient    Methods Explanation;Demonstration;Verbal cues;Handout    Comprehension Verbalized understanding;Returned demonstration;Verbal cues required            PT Short Term Goals - 01/08/20 1148      PT SHORT TERM GOAL #1   Title Patient will be independent with initial HEP    Status Achieved   01/01/20     PT SHORT TERM GOAL #2   Title Patient will verbalize/demonstrate good awareness of neutral spine posture and proper body mechanics for daily tasks    Status Achieved    Target Date 01/10/20             PT Long Term Goals - 01/13/20 1029      PT LONG TERM GOAL #1   Title Patient will be independent with ongoing/advanced HEP    Status On-going      PT LONG TERM GOAL #2   Title Patient to demonstrate ability to achieve and maintain good spinal alignment/posturing    Status On-going      PT LONG TERM GOAL #3   Title Patient to report pain reduction in frequency and intensity by >/= 50%    Status Achieved      PT LONG TERM GOAL #4   Title Patient to improve cervical AROM to WFL/WNL without pain provocation    Status Partially Met   01/13/20: achieved for cervical AROM flexion, extension however still with pain provocation     PT LONG TERM GOAL #5   Title Patient to report ability to perform ADLs, household and work-related tasks without increased pain    Status On-going   10/11: Has altered body mechanics with less pain at neck with laundry since starting therapy.                Plan - 01/20/20 1025    Clinical Impression Statement Sheri Evans reporting neck "tightness" is improving with physical therapy.  Tolerated progression of anterior chest stretching, periscapular strengthening activities well with a focus on thoracic extension promoting exercises today.  Ended visit with pt. pain free.    Comorbidities 07/08/19 - ACDF C4-5, 5-6, 6-7; lumbar surgery 04/29/19; R RTC repair + DCR 10/03/14; R carpal tunnel  release; Celiac disease; migraines    Rehab Potential Good    PT Frequency 2x / week    PT Duration 6 weeks    PT Treatment/Interventions ADLs/Self Care Home Management;Cryotherapy;Electrical Stimulation;Iontophoresis 32m/ml Dexamethasone;Moist Heat;Ultrasound;Functional  mobility training;Therapeutic activities;Therapeutic exercise;Neuromuscular re-education;Patient/family education;Manual techniques;Passive range of motion;Dry needling;Taping    PT Next Visit Plan cervicothoracic flexibility and ROM; postural strengthening; modalities PRN; posture and body mechanics review PRN    PT Home Exercise Plan 9/17 - chin tuck, scap retraction/depression, UT, LS, rhomboids & doorway pec stretches; 09/27 - red TB row and extension    Consulted and Agree with Plan of Care Patient           Patient will benefit from skilled therapeutic intervention in order to improve the following deficits and impairments:  Decreased activity tolerance, Decreased range of motion, Decreased strength, Increased fascial restricitons, Increased muscle spasms, Impaired perceived functional ability, Impaired flexibility, Impaired sensation, Impaired UE functional use, Improper body mechanics, Postural dysfunction, Pain  Visit Diagnosis: Cervicalgia  Other muscle spasm  Abnormal posture     Problem List Patient Active Problem List   Diagnosis Date Noted  . Right rotator cuff tear 10/03/2014    Bess Harvest, PTA 01/20/20 12:03 PM   [facility] [address]  [place], [place], [address] Phone: [phone]   Fax:  [phone]  Name: Sheri Evans MRN: [id number] Date of Birth: [birth year]

## 2020-01-22 ENCOUNTER — Encounter: Payer: Self-pay | Admitting: Physical Therapy

## 2020-01-22 ENCOUNTER — Other Ambulatory Visit: Payer: Self-pay

## 2020-01-22 ENCOUNTER — Ambulatory Visit: Payer: BC Managed Care – PPO | Admitting: Physical Therapy

## 2020-01-22 DIAGNOSIS — R293 Abnormal posture: Secondary | ICD-10-CM

## 2020-01-22 DIAGNOSIS — M542 Cervicalgia: Secondary | ICD-10-CM | POA: Diagnosis not present

## 2020-01-22 DIAGNOSIS — M62838 Other muscle spasm: Secondary | ICD-10-CM

## 2020-01-22 NOTE — Therapy (Signed)
[facility] [address]  [place], [place], [address] Phone: [phone]   Fax:  [phone]  Physical Therapy Treatment  Patient Details  Name: Sheri Evans MRN: [id number] Date of Birth: 07/06/1975 Referring Provider (PT): Eustace Moore, MD   Encounter Date: 01/22/2020   PT End of Session - 01/22/20 1017    Visit Number 7    Number of Visits 12    Date for PT Re-Evaluation 01/31/20    Authorization Type BCBS    PT Start Time 1017    PT Stop Time 1118    PT Time Calculation (min) 61 min    Activity Tolerance Patient tolerated treatment well    Behavior During Therapy [facility] for tasks assessed/performed           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   Ovarian cyst    Poison ivy 09/30/2014   posterior right knee   PONV (postoperative nausea and vomiting)    Rotator cuff tear 09/2014   right shoulder    Past Surgical History:  Procedure Laterality Date   ABDOMINAL HYSTERECTOMY  2013   partial   CARPAL TUNNEL RELEASE Right    [place], 2000, 05/07/2002   x 3   CHOLECYSTECTOMY     ENDOMETRIAL ABLATION  04/30/2010   ENDOMETRIAL ABLATION W/ NOVASURE  12/22/2006   HYSTEROSCOPY WITH D & C  12/22/2006   LAPAROSCOPY Right 01/23/2013   Procedure: LAPAROSCOPY OPERATIVE, Right Ovarian Cystectomy, Lysis of Adhesions;  Surgeon: Allena Katz, MD;  Location: [facility];  Service: Gynecology;  Laterality: Right;   SHOULDER ARTHROSCOPY WITH DISTAL CLAVICLE RESECTION Right 10/03/2014   Procedure: SHOULDER ARTHROSCOPY WITH DISTAL CLAVICLE RESECTION;  Surgeon: Marchia Bond, MD;  Location: [facility];  Service: Orthopedics;  Laterality: Right;   SHOULDER ARTHROSCOPY WITH ROTATOR CUFF REPAIR AND SUBACROMIAL DECOMPRESSION Right 10/03/2014   Procedure: RIGHT SHOULDER  ARTHROSCOPY DEBRIDEMENT,ACROMIOPLASTY,DISTAL CLAVICLE EXCISION,ROTATOR CUFF REPAIR ;  Surgeon: Marchia Bond, MD;  Location: [facility];  Service: Orthopedics;  Laterality: Right;   TONSILLECTOMY      There were no vitals filed for this visit.   Subjective Assessment - 01/22/20 1020    Subjective Pt continues to reports constant tightness in her neck but denies pain.    Pertinent History 07/08/19 - ACDF C4-5, 5-6, 6-7; lumbar surgery 04/29/19; R RTC repair + DCR 10/03/14; R carpal tunnel release    Patient Stated Goals "better motion left & right with less pain and to feel stronger in my neck"    Currently in Pain? No/denies    Pain Onset More than a month ago              [facility] PT Assessment - 01/22/20 1017      Assessment   Medical Diagnosis Cervicalgia following ACDF with hardware failure    Referring Provider (PT) Eustace Moore, MD    Onset Date/Surgical Date 07/08/19                         [facility] Adult PT Treatment/Exercise - 01/22/20 1017      Neck Exercises: Machines for Strengthening   UBE (Upper Arm Bike) L2.5 x 6 min (3' fwd/3' back)      Neck Exercises: Seated  Cervical Rotation Right;Left;10 reps   2 sets   Cervical Rotation Limitations 1st set - gentle cervical rotation SNAGs; 2nd set - AROM in slight flexion (pt reporting decreased pain upon return motion)    Other Seated Exercise Cervical extension SNAGs 10 x 5"      Neck Exercises: Prone   W Back 10 reps   3" hold   W Back Limitations W's + thoracic extension/cervical retraction over green Pball    Shoulder Extension 10 reps   3" hold   Shoulder Extension Limitations I's + thoracic extension/cervical retraction over green Pball    Other Prone Exercise T's & Y's + thoracic extension/cervical retraction over green Pball 10 x 3" each      Modalities   Modalities Electrical Stimulation;Moist Heat      Moist Heat Therapy   Number Minutes Moist Heat 15 Minutes    Moist Heat  Location Cervical;Shoulder      Electrical Stimulation   Electrical Stimulation Location R UT/scalenes    Electrical Stimulation Action IFC    Electrical Stimulation Parameters 80-150Hz , intensity to pt tolerance x 15'    Electrical Stimulation Goals Pain;Tone      Manual Therapy   Manual Therapy Joint mobilization;Soft tissue mobilization;Myofascial release;Passive ROM    Manual therapy comments supine    Joint Mobilization gentle C2-3 CPAs, UPAs and side glides    Soft tissue mobilization STM/DTM to R>L UT, LS, scalenes, cervical paraspinals     Myofascial Release manual TPR to R scalenes, UT nd cervical parapspinals     Passive ROM gentle MWM for B cervical rotation in slight flexion                    PT Short Term Goals - 01/22/20 1021      PT SHORT TERM GOAL #1   Title Patient will be independent with initial HEP    Status Achieved   01/01/20     PT SHORT TERM GOAL #2   Title Patient will verbalize/demonstrate good awareness of neutral spine posture and proper body mechanics for daily tasks    Status Achieved   01/08/20            PT Long Term Goals - 01/22/20 1022      PT LONG TERM GOAL #1   Title Patient will be independent with ongoing/advanced HEP    Status Partially Met    Target Date 01/31/20      PT LONG TERM GOAL #2   Title Patient to demonstrate ability to achieve and maintain good spinal alignment/posturing    Status Achieved   01/22/20   Target Date --      PT LONG TERM GOAL #3   Title Patient to report pain reduction in frequency and intensity by >/= 50%    Status Achieved   01/13/20     PT LONG TERM GOAL #4   Title Patient to improve cervical AROM to WFL/WNL without pain provocation    Status Partially Met   01/13/20: achieved for cervical AROM flexion, extension however still with pain provocation   Target Date 01/31/20      PT LONG TERM GOAL #5   Title Patient to report ability to perform ADLs, household and work-related tasks  without increased pain    Status On-going   10/11: Has altered body mechanics with less pain at neck with laundry since starting therapy.   Target Date 01/31/20  Plan - 01/22/20 1023    Clinical Impression Statement Natashia reports HEP going well with pt reporting she is mostly using the green TB at this point. Progressed postural strengthening with prone scapular and cervical retraction + thoracic extension exercises which were well tolerated. She notes more tightness/stiffness with cervical mobility than pain recently. She notes improved awareness of proper posture and consistently demonstrates improved alignment during therapy session - LTG #2 met. Addressed increased muscle tension and motion restriction with manual STM/MFR and joint mobs/MWM as well as gentle cervical SNAGs and rotation in slight flexion with pt noting decrease in pain upon return ROM to neutral positioning which has previously been an issue. Session concluded with estim and moist heat to promote further muscle relaxation as pt noting more stiffness following last visit w/o estim/heat.    Comorbidities 07/08/19 - ACDF C4-5, 5-6, 6-7; lumbar surgery 04/29/19; R RTC repair + DCR 10/03/14; R carpal tunnel release; Celiac disease; migraines    Rehab Potential Good    PT Frequency 2x / week    PT Duration 6 weeks    PT Treatment/Interventions ADLs/Self Care Home Management;Cryotherapy;Electrical Stimulation;Iontophoresis 34m/ml Dexamethasone;Moist Heat;Ultrasound;Functional mobility training;Therapeutic activities;Therapeutic exercise;Neuromuscular re-education;Patient/family education;Manual techniques;Passive range of motion;Dry needling;Taping    PT Next Visit Plan cervicothoracic flexibility and ROM; postural strengthening; modalities PRN; posture and body mechanics review PRN    PT Home Exercise Plan 9/17 - chin tuck, scap retraction/depression, UT, LS, rhomboids & doorway pec stretches; 9/27 - red TB row and  extension (green TB provided 10/6)    Consulted and Agree with Plan of Care Patient           Patient will benefit from skilled therapeutic intervention in order to improve the following deficits and impairments:  Decreased activity tolerance, Decreased range of motion, Decreased strength, Increased fascial restricitons, Increased muscle spasms, Impaired perceived functional ability, Impaired flexibility, Impaired sensation, Impaired UE functional use, Improper body mechanics, Postural dysfunction, Pain  Visit Diagnosis: Cervicalgia  Other muscle spasm  Abnormal posture     Problem List Patient Active Problem List   Diagnosis Date Noted   Right rotator cuff tear 10/03/2014    JPercival Spanish PT, MPT 01/22/2020, 12:56 PM  [facility] [address] [place] [address]Phone: [phone]  Fax:  [phone] Name: SAIMEE HELDMANMRN: [id number] of Birth: 3April 10, 1977

## 2020-01-27 ENCOUNTER — Encounter: Payer: Self-pay | Admitting: Physical Therapy

## 2020-01-27 ENCOUNTER — Other Ambulatory Visit: Payer: Self-pay

## 2020-01-27 ENCOUNTER — Ambulatory Visit: Payer: BC Managed Care – PPO | Admitting: Physical Therapy

## 2020-01-27 DIAGNOSIS — M542 Cervicalgia: Secondary | ICD-10-CM | POA: Diagnosis not present

## 2020-01-27 DIAGNOSIS — R293 Abnormal posture: Secondary | ICD-10-CM

## 2020-01-27 DIAGNOSIS — M62838 Other muscle spasm: Secondary | ICD-10-CM

## 2020-01-27 NOTE — Patient Instructions (Signed)

## 2020-01-27 NOTE — Therapy (Addendum)
[facility] [address]  [place], [place], [address] Phone: [phone]   Fax:  [phone]  Physical Therapy Treatment / Recert  Patient Details  Name: Sheri Evans MRN: [id number] Date of Birth: [birth year] Referring Provider (PT): Eustace Moore, MD   Encounter Date: 01/27/2020   PT End of Session - 01/27/20 1318    Visit Number 8    Number of Visits 12    Date for PT Re-Evaluation 02/24/20    Authorization Type BCBS    PT Start Time 1318    PT Stop Time 1400    PT Time Calculation (min) 42 min    Activity Tolerance Patient tolerated treatment well    Behavior During Therapy [facility] for tasks assessed/performed           Past Medical History:  Diagnosis Date  . Bursitis of shoulder 09/2014   right  . Celiac disease   . Migraines   . 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 Ovarian cyst   . Poison ivy 09/30/2014   posterior right knee  . PONV (postoperative nausea and vomiting)   . Rotator cuff tear 09/2014   right shoulder    Past Surgical History:  Procedure Laterality Date  . ABDOMINAL HYSTERECTOMY  2013   partial  . CARPAL TUNNEL RELEASE Right   . [place], 2000, 05/07/2002   x 3  . CHOLECYSTECTOMY    . ENDOMETRIAL ABLATION  04/30/2010  . ENDOMETRIAL ABLATION W/ NOVASURE  12/22/2006  . HYSTEROSCOPY WITH D & C  12/22/2006  . LAPAROSCOPY Right 01/23/2013   Procedure: LAPAROSCOPY OPERATIVE, Right Ovarian Cystectomy, Lysis of Adhesions;  Surgeon: Allena Katz, MD;  Location: [facility];  Service: Gynecology;  Laterality: Right;  . SHOULDER ARTHROSCOPY WITH DISTAL CLAVICLE RESECTION Right 10/03/2014   Procedure: SHOULDER ARTHROSCOPY WITH DISTAL CLAVICLE RESECTION;  Surgeon: Marchia Bond, MD;  Location: [facility];  Service: Orthopedics;  Laterality: Right;  . SHOULDER ARTHROSCOPY WITH ROTATOR CUFF REPAIR AND SUBACROMIAL DECOMPRESSION Right 10/03/2014   Procedure: RIGHT  SHOULDER ARTHROSCOPY DEBRIDEMENT,ACROMIOPLASTY,DISTAL CLAVICLE EXCISION,ROTATOR CUFF REPAIR ;  Surgeon: Marchia Bond, MD;  Location: [facility];  Service: Orthopedics;  Laterality: Right;  . TONSILLECTOMY      There were no vitals filed for this visit.   Subjective Assessment - 01/27/20 1320    Subjective Pt reports she worked 13 hrs yesterday and now feels very tight with pain at base of her skull. States it helps that she has the exercises that she can use to go stretch everything out.    Pertinent History 07/08/19 - ACDF C4-5, 5-6, 6-7; lumbar surgery 04/29/19; R RTC repair + DCR 10/03/14; R carpal tunnel release    Patient Stated Goals "better motion left & right with less pain and to feel stronger in my neck"    Currently in Pain? Yes    Pain Score 5     Pain Location Neck    Pain Orientation Upper    Pain Descriptors / Indicators Burning;Tightness    Pain Type Acute pain    Pain Frequency Intermittent                             [facility] Adult PT Treatment/Exercise - 01/27/20 1318      Neck Exercises: Machines for Strengthening   UBE (Upper Arm Bike) L2.5 x 6 min (3' fwd/3' back)  Manual Therapy   Manual Therapy Soft tissue mobilization;Myofascial release    Manual therapy comments skilled palpation and monitoring during DN    Soft tissue mobilization STM/DTM to R>L UT, LS, scalenes, cervical paraspinals & suboccipitals    Myofascial Release manual TPR to B UT & LS; B suboccipital release            Trigger Point Dry Needling - 01/27/20 1318    Consent Given? Yes    Education Handout Provided Yes    Muscles Treated Head and Neck Upper trapezius;Levator scapulae   bilateral   Upper Trapezius Response Twitch reponse elicited;Palpable increased muscle length    Levator Scapulae Response Twitch response elicited;Palpable increased muscle length                PT Education - 01/27/20 1400    Education Details Role of DN, expected  response to treatment, and recommended post-treatment activity    Person(s) Educated Patient    Methods Explanation;Handout    Comprehension Verbalized understanding            PT Short Term Goals - 01/22/20 1021      PT SHORT TERM GOAL #1   Title Patient will be independent with initial HEP    Status Achieved   01/01/20     PT SHORT TERM GOAL #2   Title Patient will verbalize/demonstrate good awareness of neutral spine posture and proper body mechanics for daily tasks    Status Achieved   01/08/20            PT Long Term Goals - 01/27/20 1328      PT LONG TERM GOAL #1   Title Patient will be independent with ongoing/advanced HEP    Status Partially Met    Target Date 02/24/20      PT LONG TERM GOAL #2   Title Patient to demonstrate ability to achieve and maintain good spinal alignment/posturing    Status Achieved   01/22/20     PT LONG TERM GOAL #3   Title Patient to report pain reduction in frequency and intensity by >/= 50%    Status Achieved   01/13/20     PT LONG TERM GOAL #4   Title Patient to improve cervical AROM to WFL/WNL without pain provocation    Status Partially Met   01/13/20: achieved for cervical AROM flexion, extension however still with pain provocation   Target Date 02/24/20      PT LONG TERM GOAL #5   Title Patient to report ability to perform ADLs, household and work-related tasks without increased pain    Status On-going   10/11: Has altered body mechanics with less pain at neck with laundry since starting therapy.   Target Date 02/24/20                 Plan - 01/27/20 1329    Clinical Impression Statement Sheri Evans reporting increased tightness in her neck and pain at the base of her skull following a 13-hr workday yesterday. Increased muscle tension with taut bands and ttp present t/o R>L UT and LS as well as B suboccipitals - addressed with manual therapy incorporating DN upon informed pt consent. Good twitch responses elicited  bilaterally with significant reduction in muscle tension and ttp noted with pt reporting immediate relief by end of session. Current POC set to expire this week but given ongoing pain and increase muscle tension with limited work/activity tolerance and LTGs only partially met, will plan to extend  POC for additional 3-4 weeks with frequency reduced to 1x/wk starting next week to address remaining deficits.    Comorbidities 07/08/19 - ACDF C4-5, 5-6, 6-7; lumbar surgery 04/29/19; R RTC repair + DCR 10/03/14; R carpal tunnel release; Celiac disease; migraines    Rehab Potential Good    PT Frequency 1x / week    PT Duration 4 weeks    PT Treatment/Interventions ADLs/Self Care Home Management;Cryotherapy;Electrical Stimulation;Iontophoresis 67m/ml Dexamethasone;Moist Heat;Ultrasound;Functional mobility training;Therapeutic activities;Therapeutic exercise;Neuromuscular re-education;Patient/family education;Manual techniques;Passive range of motion;Dry needling;Taping    PT Next Visit Plan cervicothoracic flexibility and ROM; postural strengthening; modalities PRN; posture and body mechanics review PRN    PT Home Exercise Plan 9/17 - chin tuck, scap retraction/depression, UT, LS, rhomboids & doorway pec stretches; 9/27 - red TB row and extension (green TB provided 10/6)    Consulted and Agree with Plan of Care Patient           Patient will benefit from skilled therapeutic intervention in order to improve the following deficits and impairments:  Decreased activity tolerance, Decreased range of motion, Decreased strength, Increased fascial restricitons, Increased muscle spasms, Impaired perceived functional ability, Impaired flexibility, Impaired sensation, Impaired UE functional use, Improper body mechanics, Postural dysfunction, Pain  Visit Diagnosis: Cervicalgia  Other muscle spasm  Abnormal posture     Problem List Patient Active Problem List   Diagnosis Date Noted  . Right rotator cuff tear  10/03/2014    JPercival Spanish PT, MPT 01/27/2020, 3:57 PM  [facility] [address]Phone: [phone]  Fax:  [phone] Name: Sheri SINHAMRN: [id number] of Birth: [birth year]

## 2020-01-29 ENCOUNTER — Ambulatory Visit: Payer: BC Managed Care – PPO

## 2020-01-29 ENCOUNTER — Other Ambulatory Visit: Payer: Self-pay

## 2020-01-29 DIAGNOSIS — M542 Cervicalgia: Secondary | ICD-10-CM | POA: Diagnosis not present

## 2020-01-29 DIAGNOSIS — R293 Abnormal posture: Secondary | ICD-10-CM

## 2020-01-29 DIAGNOSIS — M62838 Other muscle spasm: Secondary | ICD-10-CM

## 2020-01-29 NOTE — Therapy (Signed)
[facility] [address]  [place], [place], [address] Phone: [phone]   Fax:  [phone]  Physical Therapy Treatment  Patient Details  Name: Sheri Evans MRN: [id number] Date of Birth: 1975-10-25 Referring Provider (PT): Eustace Moore, MD   Encounter Date: 01/29/2020   PT End of Session - 01/29/20 1319    Visit Number 9    Number of Visits 12    Date for PT Re-Evaluation 02/24/20    Authorization Type BCBS    PT Start Time 1315    PT Stop Time 1358    PT Time Calculation (min) 43 min    Activity Tolerance Patient tolerated treatment well    Behavior During Therapy [facility] for tasks assessed/performed           Past Medical History:  Diagnosis Date  . Bursitis of shoulder 09/2014   right  . Celiac disease   . Migraines   . 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 Ovarian cyst   . Poison ivy 09/30/2014   posterior right knee  . PONV (postoperative nausea and vomiting)   . Rotator cuff tear 09/2014   right shoulder    Past Surgical History:  Procedure Laterality Date  . ABDOMINAL HYSTERECTOMY  2013   partial  . CARPAL TUNNEL RELEASE Right   . [place], 2000, 05/07/2002   x 3  . CHOLECYSTECTOMY    . ENDOMETRIAL ABLATION  04/30/2010  . ENDOMETRIAL ABLATION W/ NOVASURE  12/22/2006  . HYSTEROSCOPY WITH D & C  12/22/2006  . LAPAROSCOPY Right 01/23/2013   Procedure: LAPAROSCOPY OPERATIVE, Right Ovarian Cystectomy, Lysis of Adhesions;  Surgeon: Allena Katz, MD;  Location: [facility];  Service: Gynecology;  Laterality: Right;  . SHOULDER ARTHROSCOPY WITH DISTAL CLAVICLE RESECTION Right 10/03/2014   Procedure: SHOULDER ARTHROSCOPY WITH DISTAL CLAVICLE RESECTION;  Surgeon: Marchia Bond, MD;  Location: [place];  Service: Orthopedics;  Laterality: Right;  . SHOULDER ARTHROSCOPY WITH ROTATOR CUFF REPAIR AND SUBACROMIAL DECOMPRESSION Right 10/03/2014   Procedure: RIGHT SHOULDER  ARTHROSCOPY DEBRIDEMENT,ACROMIOPLASTY,DISTAL CLAVICLE EXCISION,ROTATOR CUFF REPAIR ;  Surgeon: Marchia Bond, MD;  Location: [place];  Service: Orthopedics;  Laterality: Right;  . TONSILLECTOMY      There were no vitals filed for this visit.   Subjective Assessment - 01/29/20 1317    Subjective Pt. reporting a smaller area of dull ache pain in her L upper neck area.  Notes significant improvement in tension after DN last PT session.    Pertinent History 07/08/19 - ACDF C4-5, 5-6, 6-7; lumbar surgery 04/29/19; R RTC repair + DCR 10/03/14; R carpal tunnel release    Patient Stated Goals "better motion left & right with less pain and to feel stronger in my neck"    Currently in Pain? Yes    Pain Score 5     Pain Location Neck    Pain Orientation Right;Left    Pain Descriptors / Indicators Aching;Dull    Pain Type Acute pain    Pain Onset More than a month ago    Pain Frequency Intermittent    Multiple Pain Sites No              [facility] PT Assessment - 01/29/20 0001      AROM   AROM Assessment Site Cervical    Cervical - Right Side Bend 29 - "pulling"    Cervical - Left Side Bend 33 - "R  sided pulling"    Cervical - Right Rotation 58    Cervical - Left Rotation 65                         [facility] Adult PT Treatment/Exercise - 01/29/20 0001      Neck Exercises: Machines for Strengthening   UBE (Upper Arm Bike) L3.0 x 6 min (3' fwd/3' back)      Neck Exercises: Theraband   Shoulder External Rotation 10 reps;Green    Shoulder External Rotation Limitations 2 sets B ER leaning on doorframe     Horizontal ABduction 10 reps;Green    Horizontal ABduction Limitations Hooklying on 6" bolster     Other Theraband Exercises Alternating flexion/extension with green TB x 10      Neck Exercises: Standing   Other Standing Exercises Standing TRX row x 10      Neck Exercises: Seated   Other Seated Exercise Cervical rotation SNAGs 5" x 5 each       Neck Exercises:  Stretches   Chest Stretch 2 reps;30 seconds    Chest Stretch Limitations B mid                    PT Short Term Goals - 01/22/20 1021      PT SHORT TERM GOAL #1   Title Patient will be independent with initial HEP    Status Achieved   01/01/20     PT SHORT TERM GOAL #2   Title Patient will verbalize/demonstrate good awareness of neutral spine posture and proper body mechanics for daily tasks    Status Achieved   01/08/20            PT Long Term Goals - 01/27/20 1328      PT LONG TERM GOAL #1   Title Patient will be independent with ongoing/advanced HEP    Status Partially Met    Target Date 02/24/20      PT LONG TERM GOAL #2   Title Patient to demonstrate ability to achieve and maintain good spinal alignment/posturing    Status Achieved   01/22/20     PT LONG TERM GOAL #3   Title Patient to report pain reduction in frequency and intensity by >/= 50%    Status Achieved   01/13/20     PT LONG TERM GOAL #4   Title Patient to improve cervical AROM to WFL/WNL without pain provocation    Status Partially Met   01/13/20: achieved for cervical AROM flexion, extension however still with pain provocation   Target Date 02/24/20      PT LONG TERM GOAL #5   Title Patient to report ability to perform ADLs, household and work-related tasks without increased pain    Status On-going   10/11: Has altered body mechanics with less pain at neck with laundry since starting therapy.   Target Date 02/24/20                 Plan - 01/29/20 1619    Clinical Impression Statement Bristyn reporting excellent benefit from DN last session noting reduced tightness in neck musculature.  Able to demonstrate Improved B side bending and cervical rotation (see flowsheet) after cervical stretching and cervical rotation SNAGs today progressing toward LTG #4.  Duration of session focused on postural strengthening activities for improved cervical support during work-related tasks.  Pt. pain  free to end session however with tenderness at upper cervical paraspinals thus applied trial  of moist heat to neck for relaxation of musculature.    Comorbidities 07/08/19 - ACDF C4-5, 5-6, 6-7; lumbar surgery 04/29/19; R RTC repair + DCR 10/03/14; R carpal tunnel release; Celiac disease; migraines    Rehab Potential Good    PT Frequency 1x / week    PT Duration 4 weeks    PT Treatment/Interventions ADLs/Self Care Home Management;Cryotherapy;Electrical Stimulation;Iontophoresis 49m/ml Dexamethasone;Moist Heat;Ultrasound;Functional mobility training;Therapeutic activities;Therapeutic exercise;Neuromuscular re-education;Patient/family education;Manual techniques;Passive range of motion;Dry needling;Taping    PT Next Visit Plan cervicothoracic flexibility and ROM; postural strengthening; modalities PRN; posture and body mechanics review PRN    PT Home Exercise Plan 9/17 - chin tuck, scap retraction/depression, UT, LS, rhomboids & doorway pec stretches; 9/27 - red TB row and extension (green TB provided 10/6)    Consulted and Agree with Plan of Care Patient           Patient will benefit from skilled therapeutic intervention in order to improve the following deficits and impairments:  Decreased activity tolerance, Decreased range of motion, Decreased strength, Increased fascial restricitons, Increased muscle spasms, Impaired perceived functional ability, Impaired flexibility, Impaired sensation, Impaired UE functional use, Improper body mechanics, Postural dysfunction, Pain  Visit Diagnosis: Cervicalgia  Other muscle spasm  Abnormal posture     Problem List Patient Active Problem List   Diagnosis Date Noted  . Right rotator cuff tear 10/03/2014    MBess Harvest PTA 01/29/20 4:43 PM   [facility] [address] [place] [address]Phone: [phone]  Fax:  [phone] Name: SSTAVROULA ROHDEMRN: [id number]  of Birth: 308-Mar-1977

## 2020-02-05 ENCOUNTER — Encounter: Payer: Self-pay | Admitting: Physical Therapy

## 2020-02-05 ENCOUNTER — Other Ambulatory Visit: Payer: Self-pay

## 2020-02-05 ENCOUNTER — Ambulatory Visit: Payer: BC Managed Care – PPO | Attending: Neurological Surgery | Admitting: Physical Therapy

## 2020-02-05 DIAGNOSIS — R293 Abnormal posture: Secondary | ICD-10-CM | POA: Diagnosis present

## 2020-02-05 DIAGNOSIS — M62838 Other muscle spasm: Secondary | ICD-10-CM | POA: Insufficient documentation

## 2020-02-05 DIAGNOSIS — M542 Cervicalgia: Secondary | ICD-10-CM | POA: Diagnosis not present

## 2020-02-05 NOTE — Therapy (Signed)
[facility] [address]  [place], [place], [address] Phone: [phone]   Fax:  [phone]  Physical Therapy Treatment  Patient Details  Name: Sheri Evans MRN: [id number] Date of Birth: 04-01-1976 Referring Provider (PT): Eustace Moore, MD   Encounter Date: 02/05/2020   PT End of Session - 02/05/20 1150    Visit Number 10    Number of Visits 12    Date for PT Re-Evaluation 02/24/20    Authorization Type BCBS    PT Start Time 1150    PT Stop Time 1236    PT Time Calculation (min) 46 min    Activity Tolerance Patient tolerated treatment well    Behavior During Therapy [facility] for tasks assessed/performed           Past Medical History:  Diagnosis Date  . Bursitis of shoulder 09/2014   right  . Celiac disease   . Migraines   . 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 Ovarian cyst   . Poison ivy 09/30/2014   posterior right knee  . PONV (postoperative nausea and vomiting)   . Rotator cuff tear 09/2014   right shoulder    Past Surgical History:  Procedure Laterality Date  . ABDOMINAL HYSTERECTOMY  2013   partial  . CARPAL TUNNEL RELEASE Right   . [place], 2000, 05/07/2002   x 3  . CHOLECYSTECTOMY    . ENDOMETRIAL ABLATION  04/30/2010  . ENDOMETRIAL ABLATION W/ NOVASURE  12/22/2006  . HYSTEROSCOPY WITH D & C  12/22/2006  . LAPAROSCOPY Right 01/23/2013   Procedure: LAPAROSCOPY OPERATIVE, Right Ovarian Cystectomy, Lysis of Adhesions;  Surgeon: Allena Katz, MD;  Location: [facility];  Service: Gynecology;  Laterality: Right;  . SHOULDER ARTHROSCOPY WITH DISTAL CLAVICLE RESECTION Right 10/03/2014   Procedure: SHOULDER ARTHROSCOPY WITH DISTAL CLAVICLE RESECTION;  Surgeon: Marchia Bond, MD;  Location: [place];  Service: Orthopedics;  Laterality: Right;  . SHOULDER ARTHROSCOPY WITH ROTATOR CUFF REPAIR AND SUBACROMIAL DECOMPRESSION Right 10/03/2014   Procedure: RIGHT SHOULDER  ARTHROSCOPY DEBRIDEMENT,ACROMIOPLASTY,DISTAL CLAVICLE EXCISION,ROTATOR CUFF REPAIR ;  Surgeon: Marchia Bond, MD;  Location: [place];  Service: Orthopedics;  Laterality: Right;  . TONSILLECTOMY      There were no vitals filed for this visit.   Subjective Assessment - 02/05/20 1156    Subjective Pt notes 80% improvement since DN, only noting a little pain/tightness at base of skull and R side of neck now.    Pertinent History 07/08/19 - ACDF C4-5, 5-6, 6-7; lumbar surgery 04/29/19; R RTC repair + DCR 10/03/14; R carpal tunnel release    Patient Stated Goals "better motion left & right with less pain and to feel stronger in my neck"    Currently in Pain? Yes    Pain Score 4     Pain Location Neck    Pain Orientation Right;Upper;Lateral    Pain Descriptors / Indicators Burning    Pain Frequency Constant                             [facility] Adult PT Treatment/Exercise - 02/05/20 1150      Exercises   Exercises Neck      Neck Exercises: Machines for Strengthening   UBE (Upper Arm Bike) L3.0 x 6 min (3' fwd/3' back)      Manual Therapy   Manual Therapy Soft tissue mobilization;Myofascial  release    Manual therapy comments skilled palpation and monitoring during DN    Soft tissue mobilization STM/DTM to R>L UT, LS, scalenes, SCM, cervical paraspinals & suboccipitals    Myofascial Release manual TPR to R UT & LS; B suboccipital release; pin & stretch to R SCM      Neck Exercises: Stretches   Upper Trapezius Stretch Right;Left;30 seconds;1 rep    Levator Stretch Right;Left;30 seconds;1 rep    Neck Stretch 30 seconds;2 reps    Neck Stretch Limitations R SCM stretch            Trigger Point Dry Needling - 02/05/20 1150    Consent Given? Yes    Muscles Treated Head and Neck Sternocleidomastoid;Upper trapezius;Suboccipitals;Levator scapulae;Semispinalis capitus;Cervical multifidi    Sternocleidomastoid Response Twitch response elicited;Palpable increased  muscle length   Rt   Upper Trapezius Response Twitch reponse elicited;Palpable increased muscle length   Rt   Suboccipitals Response Twitch response elicited;Palpable increased muscle length   Bil   Levator Scapulae Response Twitch response elicited;Palpable increased muscle length   Rt   Semispinalis capitus Response Twitch reponse elicited;Palpable increased muscle length   Rt   Cervical multifidi Response Twitch reponse elicited;Palpable increased muscle length   Rt               PT Education - 02/05/20 1236    Education Details HEP update - SCM stretches    Person(s) Educated Patient    Methods Explanation;Demonstration;Verbal cues;Handout    Comprehension Verbalized understanding;Verbal cues required;Returned demonstration;Need further instruction            PT Short Term Goals - 01/22/20 1021      PT SHORT TERM GOAL #1   Title Patient will be independent with initial HEP    Status Achieved   01/01/20     PT SHORT TERM GOAL #2   Title Patient will verbalize/demonstrate good awareness of neutral spine posture and proper body mechanics for daily tasks    Status Achieved   01/08/20            PT Long Term Goals - 01/27/20 1328      PT LONG TERM GOAL #1   Title Patient will be independent with ongoing/advanced HEP    Status Partially Met    Target Date 02/24/20      PT LONG TERM GOAL #2   Title Patient to demonstrate ability to achieve and maintain good spinal alignment/posturing    Status Achieved   01/22/20     PT LONG TERM GOAL #3   Title Patient to report pain reduction in frequency and intensity by >/= 50%    Status Achieved   01/13/20     PT LONG TERM GOAL #4   Title Patient to improve cervical AROM to WFL/WNL without pain provocation    Status Partially Met   01/13/20: achieved for cervical AROM flexion, extension however still with pain provocation   Target Date 02/24/20      PT LONG TERM GOAL #5   Title Patient to report ability to perform ADLs,  household and work-related tasks without increased pain    Status On-going   10/11: Has altered body mechanics with less pain at neck with laundry since starting therapy.   Target Date 02/24/20                 Plan - 02/05/20 1236    Clinical Impression Statement Sheri Evans reports 80% improvement following recent manual therapy with DN, now  only noting pain at base of skull bilaterally and in R side of neck/upper shoulder with associated headache. Given positive response to DN, addressed areas of remaining muscle tension in B suboccipitals and R>L UT, LS, cervical paraspinals and SCM with manual therapy incorporating DN - strong twitch responses elicited with palpable reduction in muscle tension noted. Review relevant stretches for muscles addressed today including instruction in SCM stretch. Pt noting some muscle soreness typical of DN at end of session but denied need for modalities to end session.    Comorbidities 07/08/19 - ACDF C4-5, 5-6, 6-7; lumbar surgery 04/29/19; R RTC repair + DCR 10/03/14; R carpal tunnel release; Celiac disease; migraines    Rehab Potential Good    PT Frequency 1x / week    PT Duration 4 weeks    PT Treatment/Interventions ADLs/Self Care Home Management;Cryotherapy;Electrical Stimulation;Iontophoresis 52m/ml Dexamethasone;Moist Heat;Ultrasound;Functional mobility training;Therapeutic activities;Therapeutic exercise;Neuromuscular re-education;Patient/family education;Manual techniques;Passive range of motion;Dry needling;Taping    PT Next Visit Plan HEP review in prep for transtion to HEP at ned of POC; manual therapy & DN as indicated; cervicothoracic flexibility and ROM; postural strengthening; modalities PRN; posture and body mechanics review PRN    PT Home Exercise Plan 9/17 - chin tuck, scap retraction/depression, UT, LS, rhomboids & doorway pec stretches; 9/27 - red TB row and extension (green TB provided 10/6); 11/3 - SCM stretch    Consulted and Agree with Plan  of Care Patient           Patient will benefit from skilled therapeutic intervention in order to improve the following deficits and impairments:  Decreased activity tolerance, Decreased range of motion, Decreased strength, Increased fascial restricitons, Increased muscle spasms, Impaired perceived functional ability, Impaired flexibility, Impaired sensation, Impaired UE functional use, Improper body mechanics, Postural dysfunction, Pain  Visit Diagnosis: Cervicalgia  Other muscle spasm  Abnormal posture     Problem List Patient Active Problem List   Diagnosis Date Noted  . Right rotator cuff tear 10/03/2014    JPercival Spanish PT, MPT 02/05/2020, 1:40 PM  [facility] [place] [address]Phone: [phone]  Fax:  [phone] Name: Sheri MIRAMONTESMRN: [id number] of Birth: 3Apr 21, 1977

## 2020-02-05 NOTE — Patient Instructions (Signed)
    Home exercise program created by Ardath Lepak, PT.  For questions, please contact Lebert Lovern via phone at [phone] or email at [email or web address]  [facility] [address]  [address] [place], [place], [address] Phone: [phone]   Fax:  [phone]    

## 2020-02-12 ENCOUNTER — Ambulatory Visit: Payer: BC Managed Care – PPO | Admitting: Physical Therapy

## 2020-02-19 ENCOUNTER — Encounter: Payer: Self-pay | Admitting: Physical Therapy

## 2020-02-19 ENCOUNTER — Other Ambulatory Visit: Payer: Self-pay

## 2020-02-19 ENCOUNTER — Ambulatory Visit: Payer: BC Managed Care – PPO | Admitting: Physical Therapy

## 2020-02-19 DIAGNOSIS — M542 Cervicalgia: Secondary | ICD-10-CM

## 2020-02-19 DIAGNOSIS — M62838 Other muscle spasm: Secondary | ICD-10-CM

## 2020-02-19 DIAGNOSIS — R293 Abnormal posture: Secondary | ICD-10-CM

## 2020-02-19 NOTE — Therapy (Signed)
[facility] [address]  [place], [place], [address] Phone: [phone]   Fax:  [phone]  Physical Therapy Treatment  Patient Details  Name: NITZA SCHMID MRN: [id number] Date of Birth: 10/01/1975 Referring Provider (PT): Eustace Moore, MD   Encounter Date: 02/19/2020   PT End of Session - 02/19/20 1152    Visit Number 11    Number of Visits 12    Date for PT Re-Evaluation 02/24/20    Authorization Type BCBS    PT Start Time 1152    PT Stop Time 1241    PT Time Calculation (min) 49 min    Activity Tolerance Patient tolerated treatment well    Behavior During Therapy [facility] for tasks assessed/performed           Past Medical History:  Diagnosis Date  . Bursitis of shoulder 09/2014   right  . Celiac disease   . Migraines   . 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 Ovarian cyst   . Poison ivy 09/30/2014   posterior right knee  . PONV (postoperative nausea and vomiting)   . Rotator cuff tear 09/2014   right shoulder    Past Surgical History:  Procedure Laterality Date  . ABDOMINAL HYSTERECTOMY  2013   partial  . CARPAL TUNNEL RELEASE Right   . [place], 2000, 05/07/2002   x 3  . CHOLECYSTECTOMY    . ENDOMETRIAL ABLATION  04/30/2010  . ENDOMETRIAL ABLATION W/ NOVASURE  12/22/2006  . HYSTEROSCOPY WITH D & C  12/22/2006  . LAPAROSCOPY Right 01/23/2013   Procedure: LAPAROSCOPY OPERATIVE, Right Ovarian Cystectomy, Lysis of Adhesions;  Surgeon: Allena Katz, MD;  Location: [facility];  Service: Gynecology;  Laterality: Right;  . SHOULDER ARTHROSCOPY WITH DISTAL CLAVICLE RESECTION Right 10/03/2014   Procedure: SHOULDER ARTHROSCOPY WITH DISTAL CLAVICLE RESECTION;  Surgeon: Marchia Bond, MD;  Location: [facility];  Service: Orthopedics;  Laterality: Right;  . SHOULDER ARTHROSCOPY WITH ROTATOR CUFF REPAIR AND SUBACROMIAL DECOMPRESSION Right 10/03/2014   Procedure: RIGHT SHOULDER  ARTHROSCOPY DEBRIDEMENT,ACROMIOPLASTY,DISTAL CLAVICLE EXCISION,ROTATOR CUFF REPAIR ;  Surgeon: Marchia Bond, MD;  Location: [facility];  Service: Orthopedics;  Laterality: Right;  . TONSILLECTOMY      There were no vitals filed for this visit.   Subjective Assessment - 02/19/20 1155    Subjective Pt reports the DN seemed to realy help get things calmed down and now everything feels like it starting to come together.    Pertinent History 07/08/19 - ACDF C4-5, 5-6, 6-7; lumbar surgery 04/29/19; R RTC repair + DCR 10/03/14; R carpal tunnel release    Patient Stated Goals "better motion left & right with less pain and to feel stronger in my neck"    Currently in Pain? Yes    Pain Score 3    3-4/10   Pain Location Neck    Pain Orientation Lower    Pain Descriptors / Indicators Burning    Pain Type Acute pain    Pain Frequency Intermittent   more commonly present than not                            [facility] Adult PT Treatment/Exercise - 02/19/20 1152      Exercises   Exercises Neck      Neck Exercises: Machines for Strengthening   UBE (Upper Arm Bike) L3.0 x 6 min (3'  fwd/3' back)      Neck Exercises: Theraband   Shoulder External Rotation 10 reps;Red    Shoulder External Rotation Limitations standing with back against doorframe to promote scapular retraction    Horizontal ABduction 10 reps;Red    Horizontal ABduction Limitations standing with back against doorframe to promote scapular retraction    Other Theraband Exercises Scap retraction + red TB alt UE diagonals 10 x 3"; standing with back against doorframe to promote scapular retraction      Manual Therapy   Manual Therapy Soft tissue mobilization;Myofascial release    Manual therapy comments skilled palpation and monitoring during DN    Soft tissue mobilization STM/DTM to B UT, LS, scalenes & cervical paraspinals    Myofascial Release manual TPR to B UT, LS & scalenes            Trigger Point Dry  Needling - 02/19/20 1152    Consent Given? Yes    Muscles Treated Head and Neck Upper trapezius;Levator scapulae;Scalenes;Semispinalis capitus   Bilateral   Upper Trapezius Response Twitch reponse elicited;Palpable increased muscle length    Levator Scapulae Response Twitch response elicited;Palpable increased muscle length    Scalenes Response Twitch reponse elicited;Palpable increased muscle length    Semispinalis capitus Response Twitch reponse elicited;Palpable increased muscle length                PT Education - 02/19/20 1241    Education Details HEP update - scapular/postural strengthening progression    Person(s) Educated Patient    Methods Explanation;Demonstration;Verbal cues;Handout    Comprehension Verbalized understanding;Verbal cues required;Returned demonstration            PT Short Term Goals - 01/22/20 1021      PT SHORT TERM GOAL #1   Title Patient will be independent with initial HEP    Status Achieved   01/01/20     PT SHORT TERM GOAL #2   Title Patient will verbalize/demonstrate good awareness of neutral spine posture and proper body mechanics for daily tasks    Status Achieved   01/08/20            PT Long Term Goals - 01/27/20 1328      PT LONG TERM GOAL #1   Title Patient will be independent with ongoing/advanced HEP    Status Partially Met    Target Date 02/24/20      PT LONG TERM GOAL #2   Title Patient to demonstrate ability to achieve and maintain good spinal alignment/posturing    Status Achieved   01/22/20     PT LONG TERM GOAL #3   Title Patient to report pain reduction in frequency and intensity by >/= 50%    Status Achieved   01/13/20     PT LONG TERM GOAL #4   Title Patient to improve cervical AROM to WFL/WNL without pain provocation    Status Partially Met   01/13/20: achieved for cervical AROM flexion, extension however still with pain provocation   Target Date 02/24/20      PT LONG TERM GOAL #5   Title Patient to report  ability to perform ADLs, household and work-related tasks without increased pain    Status On-going   10/11: Has altered body mechanics with less pain at neck with laundry since starting therapy.   Target Date 02/24/20                 Plan - 02/19/20 1200    Clinical Impression Statement Nyima reports  DN seems to have really helped get things to calm down and allow for increased activity tolerance including more laborious tasks such as moving furniture around with causing increased neck pain. She notes some lingering soreness/tightness at the base of her neck today and requested DN to help with this. Good twitch responses elicited with DN which along with manual STM/MFR resulted in palpable reduction in muscle tension and ttp. HEP reviewed with pt noting continued benefit from all stretches and strengthening exercises updated to further reinforce postural strength. [place] requesting to defer final visit for 2 weeks to see how she manages with the home program in hopes of transitioning full time to HEP as of next visit.    Comorbidities 07/08/19 - ACDF C4-5, 5-6, 6-7; lumbar surgery 04/29/19; R RTC repair + DCR 10/03/14; R carpal tunnel release; Celiac disease; migraines    Rehab Potential Good    PT Frequency 1x / week    PT Duration 4 weeks    PT Treatment/Interventions ADLs/Self Care Home Management;Cryotherapy;Electrical Stimulation;Iontophoresis 72m/ml Dexamethasone;Moist Heat;Ultrasound;Functional mobility training;Therapeutic activities;Therapeutic exercise;Neuromuscular re-education;Patient/family education;Manual techniques;Passive range of motion;Dry needling;Taping    PT Next Visit Plan goal assessment + transition to HEP with [id number] vs recert; manual therapy & DN as indicated; cervicothoracic flexibility and ROM; postural strengthening; modalities PRN; posture and body mechanics review PRN    PT Home Exercise Plan 9/17 - chin tuck, scap retraction/depression, UT, LS, rhomboids  & doorway pec stretches; 9/27 - red TB row and extension (green TB provided 10/6); 11/3 - SCM stretch; 11/17 - scap retraction + red TB horiz ABD, ER & diagonals    Consulted and Agree with Plan of Care Patient           Patient will benefit from skilled therapeutic intervention in order to improve the following deficits and impairments:  Decreased activity tolerance, Decreased range of motion, Decreased strength, Increased fascial restricitons, Increased muscle spasms, Impaired perceived functional ability, Impaired flexibility, Impaired sensation, Impaired UE functional use, Improper body mechanics, Postural dysfunction, Pain  Visit Diagnosis: Cervicalgia  Other muscle spasm  Abnormal posture     Problem List Patient Active Problem List   Diagnosis Date Noted  . Right rotator cuff tear 10/03/2014    JPercival Spanish PT, MPT 02/19/2020, 1:17 PM  [facility] [place] [address]Phone: [phone]  Fax:  [phone] Name: SSTEPHANEY STEVENMRN: [id number] of Birth: [birth year]

## 2020-02-19 NOTE — Patient Instructions (Signed)
    Home exercise program created by Faye Sanfilippo, PT.  For questions, please contact Aaliayah Miao via phone at [phone] or email at [email or web address]  [facility] [address]  [address] [place], [place], [address] Phone: [phone]   Fax:  [phone]    

## 2020-03-04 ENCOUNTER — Ambulatory Visit: Payer: BC Managed Care – PPO | Attending: Neurological Surgery | Admitting: Physical Therapy

## 2020-03-04 ENCOUNTER — Encounter: Payer: Self-pay | Admitting: Physical Therapy

## 2020-03-04 ENCOUNTER — Other Ambulatory Visit: Payer: Self-pay

## 2020-03-04 DIAGNOSIS — R293 Abnormal posture: Secondary | ICD-10-CM | POA: Insufficient documentation

## 2020-03-04 DIAGNOSIS — M542 Cervicalgia: Secondary | ICD-10-CM | POA: Diagnosis present

## 2020-03-04 DIAGNOSIS — M62838 Other muscle spasm: Secondary | ICD-10-CM | POA: Insufficient documentation

## 2020-03-04 NOTE — Therapy (Addendum)
[facility] [address]  [place], [place], [address] Phone: [phone]   Fax:  [phone]  Physical Therapy Treatment / Discharge Summary  Patient Details  Name: Sheri Evans MRN: [id number] Date of Birth: 06/20/1975 Referring Provider (PT): Eustace Moore, MD   Encounter Date: 03/04/2020   PT End of Session - 03/04/20 0847    Visit Number 12    Number of Visits 12    Date for PT Re-Evaluation 02/24/20    Authorization Type BCBS    PT Start Time 0847    PT Stop Time 0925    PT Time Calculation (min) 38 min    Activity Tolerance Patient tolerated treatment well    Behavior During Therapy [facility] for tasks assessed/performed           Past Medical History:  Diagnosis Date  . Bursitis of shoulder 09/2014   right  . Celiac disease   . Migraines   . 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 Ovarian cyst   . Poison ivy 09/30/2014   posterior right knee  . PONV (postoperative nausea and vomiting)   . Rotator cuff tear 09/2014   right shoulder    Past Surgical History:  Procedure Laterality Date  . ABDOMINAL HYSTERECTOMY  2013   partial  . CARPAL TUNNEL RELEASE Right   . [place], 2000, 05/07/2002   x 3  . CHOLECYSTECTOMY    . ENDOMETRIAL ABLATION  04/30/2010  . ENDOMETRIAL ABLATION W/ NOVASURE  12/22/2006  . HYSTEROSCOPY WITH D & C  12/22/2006  . LAPAROSCOPY Right 01/23/2013   Procedure: LAPAROSCOPY OPERATIVE, Right Ovarian Cystectomy, Lysis of Adhesions;  Surgeon: Allena Katz, MD;  Location: [facility];  Service: Gynecology;  Laterality: Right;  . SHOULDER ARTHROSCOPY WITH DISTAL CLAVICLE RESECTION Right 10/03/2014   Procedure: SHOULDER ARTHROSCOPY WITH DISTAL CLAVICLE RESECTION;  Surgeon: Marchia Bond, MD;  Location: [facility];  Service: Orthopedics;  Laterality: Right;  . SHOULDER ARTHROSCOPY WITH ROTATOR CUFF REPAIR AND SUBACROMIAL DECOMPRESSION Right 10/03/2014    Procedure: RIGHT SHOULDER ARTHROSCOPY DEBRIDEMENT,ACROMIOPLASTY,DISTAL CLAVICLE EXCISION,ROTATOR CUFF REPAIR ;  Surgeon: Marchia Bond, MD;  Location: [facility];  Service: Orthopedics;  Laterality: Right;  . TONSILLECTOMY      There were no vitals filed for this visit.   Subjective Assessment - 03/04/20 0848    Subjective Pt reports no pain recently but has noted some muscle spasms in her neck on occasion.    Pertinent History 07/08/19 - ACDF C4-5, 5-6, 6-7; lumbar surgery 04/29/19; R RTC repair + DCR 10/03/14; R carpal tunnel release    Patient Stated Goals "better motion left & right with less pain and to feel stronger in my neck"    Currently in Pain? No/denies              [facility] PT Assessment - 03/04/20 0847      Assessment   Medical Diagnosis Cervicalgia following ACDF with hardware failure    Referring Provider (PT) Eustace Moore, MD    Onset Date/Surgical Date 07/08/19    Next MD Visit TBD      Observation/Other Assessments   Focus on Therapeutic Outcomes (FOTO)  Neck - 81% (19% limitation)      AROM   Cervical Flexion 51    Cervical Extension 58    Cervical - Right Side Bend 32    Cervical - Left Side Bend 29  Cervical - Right Rotation 58    Cervical - Left Rotation 62      Strength   Right Shoulder Flexion 5/5    Right Shoulder ABduction 5/5    Right Shoulder Internal Rotation 5/5    Right Shoulder External Rotation 5/5    Left Shoulder Flexion 5/5    Left Shoulder ABduction 5/5    Left Shoulder Internal Rotation 5/5    Left Shoulder External Rotation 5/5                         [facility] Adult PT Treatment/Exercise - 03/04/20 0847      Exercises   Exercises Neck      Neck Exercises: Machines for Strengthening   UBE (Upper Arm Bike) L3.0 x 6 min (3' fwd/3' back)            Trigger Point Dry Needling - 03/04/20 0847    Consent Given? Yes    Muscles Treated Head and Neck Semispinalis capitus;Levator scapulae    Levator  Scapulae Response Twitch response elicited;Palpable increased muscle length   Rt   Semispinalis capitus Response Twitch reponse elicited;Palpable increased muscle length   Bil                 PT Short Term Goals - 01/22/20 1021      PT SHORT TERM GOAL #1   Title Patient will be independent with initial HEP    Status Achieved   01/01/20     PT SHORT TERM GOAL #2   Title Patient will verbalize/demonstrate good awareness of neutral spine posture and proper body mechanics for daily tasks    Status Achieved   01/08/20            PT Long Term Goals - 03/04/20 0849      PT LONG TERM GOAL #1   Title Patient will be independent with ongoing/advanced HEP    Status Achieved   03/04/20     PT LONG TERM GOAL #2   Title Patient to demonstrate ability to achieve and maintain good spinal alignment/posturing    Status Achieved   01/22/20     PT LONG TERM GOAL #3   Title Patient to report pain reduction in frequency and intensity by >/= 50%    Status Achieved   01/13/20     PT LONG TERM GOAL #4   Title Patient to improve cervical AROM to WFL/WNL without pain provocation    Status Achieved   03/04/20     PT LONG TERM GOAL #5   Title Patient to report ability to perform ADLs, household and work-related tasks without increased pain    Status Achieved   03/04/20                Plan - 03/04/20 0851    Clinical Impression Statement Denaja reports 90% improvement in pain and activity tolerance since start of PT, with no pain currently and only noting occasional muscle spasms in her neck - muscle spasms addressed with manual therapy including DN to B cervical paraspinals and R LS with good twitch responses elicited resulting in reduction in muscle tension and hopeful resolution of muscle spasms. She notes improved postural awareness with typical daily tasks and no current limitations due to pain - FOTO indicating reduction in functional limitation to 19%, exceeding predicted level of  33%.  Cervical ROM now [facility] w/o limitation due to pain and B shoulder strength now 5/5. All goals  met and pt feels comfortable transitioning to her HEP at this time but would like to remain on hold for 30 days in the event that the muscle spasms return, or issues arise with HEP.    Comorbidities 07/08/19 - ACDF C4-5, 5-6, 6-7; lumbar surgery 04/29/19; R RTC repair + DCR 10/03/14; R carpal tunnel release; Celiac disease; migraines    Rehab Potential Good    PT Treatment/Interventions ADLs/Self Care Home Management;Cryotherapy;Electrical Stimulation;Iontophoresis 58m/ml Dexamethasone;Moist Heat;Ultrasound;Functional mobility training;Therapeutic activities;Therapeutic exercise;Neuromuscular re-education;Patient/family education;Manual techniques;Passive range of motion;Dry needling;Taping    PT Next Visit Plan transition to HEP with 30-day hold    PT Home Exercise Plan 9/17 - chin tuck, scap retraction/depression, UT, LS, rhomboids & doorway pec stretches; 9/27 - red TB row and extension (green TB provided 10/6); 11/3 - SCM stretch; 11/17 - scap retraction + red TB horiz ABD, ER & diagonals    Consulted and Agree with Plan of Care Patient           Patient will benefit from skilled therapeutic intervention in order to improve the following deficits and impairments:  Decreased activity tolerance, Decreased range of motion, Decreased strength, Increased fascial restricitons, Increased muscle spasms, Impaired perceived functional ability, Impaired flexibility, Impaired sensation, Impaired UE functional use, Improper body mechanics, Postural dysfunction, Pain  Visit Diagnosis: Cervicalgia  Other muscle spasm  Abnormal posture     Problem List Patient Active Problem List   Diagnosis Date Noted  . Right rotator cuff tear 10/03/2014    JPercival Spanish PT, MPT 03/04/2020, 9:35 AM  [facility] [place]  [address]Phone: [phone]  Fax:  [phone] Name: SMYKIRA HOFMEISTERMRN: [id number] of Birth: [birth year]  PHYSICAL THERAPY DISCHARGE SUMMARY  Visits from Start of Care: 12  Current functional level related to goals / functional outcomes:   Refer to above clinical impression for status as of last visit on 03/04/2020. Patient was placed on hold for 30 days and has not needed to return to PT, therefore will proceed with discharge from PT for this episode.   Remaining deficits:   As above.   Education / Equipment:   HEP, pBiomedical scientisteducation  Plan: Patient agrees to discharge.  Patient goals were met. Patient is being discharged due to being pleased with the current functional level.  ?????    JPercival Spanish PT, MPT 04/07/20, 10:25 AM  [facility] [place] [address]Phone: [phone]  Fax:  [phone]

## 2020-10-24 ENCOUNTER — Emergency Department (HOSPITAL_BASED_OUTPATIENT_CLINIC_OR_DEPARTMENT_OTHER)
Admission: EM | Admit: 2020-10-24 | Discharge: 2020-10-24 | Disposition: A | Discharge status: Home / Self Care | Payer: BC Managed Care – PPO | Attending: Emergency Medicine | Admitting: Emergency Medicine

## 2020-10-24 ENCOUNTER — Other Ambulatory Visit: Payer: Self-pay

## 2020-10-24 ENCOUNTER — Encounter (HOSPITAL_BASED_OUTPATIENT_CLINIC_OR_DEPARTMENT_OTHER): Payer: Self-pay | Admitting: *Deleted

## 2020-10-24 ENCOUNTER — Emergency Department (HOSPITAL_BASED_OUTPATIENT_CLINIC_OR_DEPARTMENT_OTHER): Payer: BC Managed Care – PPO

## 2020-10-24 DIAGNOSIS — R0789 Other chest pain: Secondary | ICD-10-CM | POA: Diagnosis not present

## 2020-10-24 DIAGNOSIS — R002 Palpitations: Secondary | ICD-10-CM | POA: Diagnosis not present

## 2020-10-24 DIAGNOSIS — Z79899 Other long term (current) drug therapy: Secondary | ICD-10-CM | POA: Diagnosis not present

## 2020-10-24 DIAGNOSIS — Z20822 Contact with and (suspected) exposure to covid-19: Secondary | ICD-10-CM | POA: Insufficient documentation

## 2020-10-24 DIAGNOSIS — R42 Dizziness and giddiness: Secondary | ICD-10-CM | POA: Diagnosis not present

## 2020-10-24 LAB — BASIC METABOLIC PANEL
Anion gap: 10 (ref 5–15)
BUN: 14 mg/dL (ref 6–20)
CO2: 24 mmol/L (ref 22–32)
Calcium: 8.8 mg/dL — ABNORMAL LOW (ref 8.9–10.3)
Chloride: 102 mmol/L (ref 98–111)
Creatinine, Ser: 0.75 mg/dL (ref 0.44–1.00)
GFR, Estimated: >60 mL/min (ref >60)
Glucose, Bld: 103 mg/dL — ABNORMAL HIGH (ref 70–99)
Potassium: 3.8 mmol/L (ref 3.5–5.1)
Sodium: 136 mmol/L (ref 135–145)

## 2020-10-24 LAB — RAPID URINE DRUG SCREEN, HOSP PERFORMED
Amphetamines: NOT DETECTED
Barbiturates: NOT DETECTED
Benzodiazepines: NOT DETECTED
Cocaine: NOT DETECTED
Opiates: NOT DETECTED
Tetrahydrocannabinol: NOT DETECTED

## 2020-10-24 LAB — URINALYSIS, ROUTINE W REFLEX MICROSCOPIC
Bilirubin Urine: NEGATIVE
Glucose, UA: NEGATIVE mg/dL
Hgb urine dipstick: NEGATIVE
Ketones, ur: NEGATIVE mg/dL
Leukocytes,Ua: NEGATIVE
Nitrite: NEGATIVE
Protein, ur: NEGATIVE mg/dL
Specific Gravity, Urine: 1.015 (ref 1.005–1.030)
pH: 6.5 (ref 5.0–8.0)

## 2020-10-24 LAB — CBC
HCT: 45.4 % (ref 36.0–46.0)
Hemoglobin: 15.6 g/dL — ABNORMAL HIGH (ref 12.0–15.0)
MCH: 31.2 pg (ref 26.0–34.0)
MCHC: 34.4 g/dL (ref 30.0–36.0)
MCV: 90.8 fL (ref 80.0–100.0)
Platelets: 241 10*3/uL (ref 150–400)
RBC: 5 MIL/uL (ref 3.87–5.11)
RDW: 13.2 % (ref 11.5–15.5)
WBC: 8.1 10*3/uL (ref 4.0–10.5)
nRBC: 0 % (ref 0.0–0.2)

## 2020-10-24 LAB — RESP PANEL BY RT-PCR (FLU A&B, COVID) ARPGX2
Influenza A by PCR: NEGATIVE
Influenza B by PCR: NEGATIVE
SARS Coronavirus 2 by RT PCR: NEGATIVE

## 2020-10-24 LAB — TROPONIN I (HIGH SENSITIVITY): Troponin I (High Sensitivity): 4 ng/L (ref <18)

## 2020-10-24 LAB — D-DIMER, QUANTITATIVE: D-Dimer, Quant: <0.27 ug/mL-FEU (ref 0.00–0.50)

## 2020-10-24 MED ORDER — SODIUM CHLORIDE 0.9 % IV BOLUS
1000.0000 mL | Freq: Once | INTRAVENOUS | Status: AC
  Administered 2020-10-24: 1000 mL via INTRAVENOUS

## 2020-10-24 NOTE — ED Triage Notes (Signed)
Pt reports her HR was increased at work last night according to [employer]- Rate 120-170 last night. Reports sweating and feeling light headed. C/o mid chest pain today. Neg home covid test yesterday

## 2020-10-24 NOTE — ED Provider Notes (Signed)
[facility] EMERGENCY DEPARTMENT Provider Note   CSN: [id number] Arrival date & time: 10/24/20  1439     History Chief Complaint  Patient presents with   Chest Pain    Sheri Evans is a 45 y.o. female.  Pt presents to the ED today with palpitations.  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  She was at work last night and felt lightheaded.  She checked her watch and her HR was 120s-170s.  She was not busy at the time.  She thought she might be dehydrated, so drank a lot of fluids and gatorade, but HR stayed elevated.  She developed some pressure in her chest while she was at work today.  So, she came in today.        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   Ovarian cyst    Poison ivy 09/30/2014   posterior right knee   PONV (postoperative nausea and vomiting)    Rotator cuff tear 09/2014   right shoulder    Patient Active Problem List   Diagnosis Date Noted   Right rotator cuff tear 10/03/2014    Past Surgical History:  Procedure Laterality Date   ABDOMINAL HYSTERECTOMY  04/05/2011   partial   BACK SURGERY     CARPAL TUNNEL RELEASE Right    CESAREAN SECTION  1996, 2000, 05/07/2002   x 3   CHOLECYSTECTOMY     ENDOMETRIAL ABLATION  04/30/2010   ENDOMETRIAL ABLATION W/ NOVASURE  12/22/2006   HYSTEROSCOPY WITH D & C  12/22/2006   LAPAROSCOPY Right 01/23/2013   Procedure: LAPAROSCOPY OPERATIVE, Right Ovarian Cystectomy, Lysis of Adhesions;  Surgeon: Allena Katz, MD;  Location: [facility];  Service: Gynecology;  Laterality: Right;   SHOULDER ARTHROSCOPY WITH DISTAL CLAVICLE RESECTION Right 10/03/2014   Procedure: SHOULDER ARTHROSCOPY WITH DISTAL CLAVICLE RESECTION;  Surgeon: Marchia Bond, MD;  Location: [facility];  Service: Orthopedics;  Laterality: Right;   SHOULDER ARTHROSCOPY WITH ROTATOR CUFF REPAIR AND SUBACROMIAL  DECOMPRESSION Right 10/03/2014   Procedure: RIGHT SHOULDER ARTHROSCOPY DEBRIDEMENT,ACROMIOPLASTY,DISTAL CLAVICLE EXCISION,ROTATOR CUFF REPAIR ;  Surgeon: Marchia Bond, MD;  Location: [facility];  Service: Orthopedics;  Laterality: Right;   TONSILLECTOMY       OB History     Gravida  3   Para  3   Term  3   Preterm  0   AB  0   Living  3      SAB  0   IAB  0   Ectopic  0   Multiple  0   Live Births              No family history on file.  Social History   Tobacco Use   Smoking status: Never   Smokeless tobacco: Never  Vaping Use   Vaping Use: Never used  Substance Use Topics   Alcohol use: Yes    Comment: daily - 3 drinks   Drug use: No    Home Medications Prior to Admission medications   Medication Sig Start Date End Date Taking? Authorizing Provider  EPINEPHrine (ADRENACLICK) 0.3 MC/8.0 mL IJ SOAJ injection Inject 0.3 mLs (0.3 mg total) into the muscle once. 04/29/15   Ward, Delice Bison, DO  HYDROcodone-acetaminophen (NORCO/VICODIN) 5-325 MG per tablet Take 1 tablet by mouth every 6 (six)  hours as needed for moderate pain. Patient not taking: Reported on 12/20/2019    [provider]  ibuprofen (ADVIL,MOTRIN) 200 MG tablet Take 800 mg by mouth every 6 (six) hours as needed.    [provider]  meloxicam (MOBIC) 15 MG tablet Take 1 tablet (15 mg total) by mouth daily. Take 1 daily with food. 02/12/18   Margarita Mail, PA-C  Multiple Vitamin (MULTIVITAMIN WITH MINERALS) TABS tablet Take 1 tablet by mouth daily.    [provider]  ondansetron (ZOFRAN ODT) 4 MG disintegrating tablet Take 1 tablet (4 mg total) by mouth every 8 (eight) hours as needed for nausea or vomiting. Patient not taking: Reported on 12/20/2019 06/09/17   Ward, Delice Bison, DO  traMADol (ULTRAM) 50 MG tablet Take 1 tablet (50 mg total) by mouth every 6 (six) hours as needed. Patient not taking: Reported on 12/20/2019 02/12/18   Margarita Mail, PA-C   famotidine (PEPCID) 20 MG tablet Take 1 tablet (20 mg total) by mouth 2 (two) times daily. 04/29/15 09/30/18  Ward, Delice Bison, DO    Allergies    Kiwi extract, Penicillins, Dilaudid [hydromorphone hcl], Wheat bran, and Doxycycline  Review of Systems   Review of Systems  Cardiovascular:  Positive for chest pain and palpitations.  All other systems reviewed and are negative.  Physical Exam Updated Vital Signs BP 139/79 (BP Location: Right Arm)   Pulse 65   Temp 98.2 F (36.8 C) (Oral)   Resp 20   Ht 5' 9"  (1.753 m)   Wt 124.7 kg   LMP 05/01/2011   SpO2 99%   BMI 40.61 kg/m   Physical Exam Vitals and nursing note reviewed.  Constitutional:      Appearance: She is well-developed.  HENT:     Head: Normocephalic and atraumatic.  Eyes:     Extraocular Movements: Extraocular movements intact.     Pupils: Pupils are equal, round, and reactive to light.  Cardiovascular:     Rate and Rhythm: Normal rate and regular rhythm.     Heart sounds: Normal heart sounds.  Pulmonary:     Effort: Pulmonary effort is normal.     Breath sounds: Normal breath sounds.  Abdominal:     General: Bowel sounds are normal.     Palpations: Abdomen is soft.  Musculoskeletal:        General: Normal range of motion.     Cervical back: Normal range of motion and neck supple.  Skin:    General: Skin is warm.     Capillary Refill: Capillary refill takes less than 2 seconds.  Neurological:     General: No focal deficit present.     Mental Status: She is alert and oriented to person, place, and time.  Psychiatric:        Mood and Affect: Mood normal.        Behavior: Behavior normal.    ED Results / Procedures / Treatments   Labs (all labs ordered are listed, but only abnormal results are displayed) Labs Reviewed  BASIC METABOLIC PANEL - Abnormal; Notable for the following components:      Result Value   Glucose, Bld 103 (*)    Calcium 8.8 (*)    All other components within normal limits  CBC  - Abnormal; Notable for the following components:   Hemoglobin 15.6 (*)    All other components within normal limits  RESP PANEL BY RT-PCR (FLU A&B, COVID) ARPGX2  URINALYSIS, ROUTINE W REFLEX MICROSCOPIC  RAPID URINE DRUG SCREEN, HOSP PERFORMED  D-DIMER, QUANTITATIVE (NOT AT [facility])  TSH  TROPONIN I (HIGH SENSITIVITY)  TROPONIN I (HIGH SENSITIVITY)    EKG EKG Interpretation  Date/Time:  Saturday October 24 2020 14:47:57 EDT Ventricular Rate:  79 PR Interval:  153 QRS Duration: 120 QT Interval:  374 QTC Calculation: 429 R Axis:   -37 Text Interpretation: Sinus rhythm Incomplete RBBB and LAFB Probable left ventricular hypertrophy No significant change since last tracing Confirmed by Isla Pence [phone]) on 10/24/2020 3:01:33 PM  Radiology DG Chest Port 1 View  Result Date: 10/24/2020 CLINICAL DATA:  Chest pain with shortness of breath. EXAM: PORTABLE CHEST 1 VIEW COMPARISON:  Chest radiograph dated 05/24/2018. FINDINGS: The heart size and mediastinal contours are within normal limits. Both lungs are clear. Fixation hardware overlies the lower cervical spine. IMPRESSION: No active disease. Electronically Signed   By: Zerita Boers M.D.   On: 10/24/2020 15:27    Procedures Procedures   Medications Ordered in ED Medications  sodium chloride 0.9 % bolus 1,000 mL (0 mLs Intravenous Stopped 10/24/20 1709)    ED Course  I have reviewed the triage vital signs and the nursing notes.  Pertinent labs & imaging results that were available during my care of the patient were reviewed by me and considered in my medical decision making (see chart for details).    MDM Rules/Calculators/A&P                           Eval here today is neg.  HR has been in the 70s while here.  She is referred to cards.  She is to return if worse.   Final Clinical Impression(s) / ED Diagnoses Final diagnoses:  Atypical chest pain  Palpitations    Rx / DC Orders ED Discharge Orders          Ordered     Ambulatory referral to Cardiology        10/24/20 1724             Isla Pence, MD 10/24/20 1725

## 2020-10-25 LAB — TSH: TSH: 1.36 u[IU]/mL (ref 0.350–4.500)

## 2021-12-05 ENCOUNTER — Emergency Department (HOSPITAL_BASED_OUTPATIENT_CLINIC_OR_DEPARTMENT_OTHER): Payer: BC Managed Care – PPO

## 2021-12-05 ENCOUNTER — Emergency Department (HOSPITAL_BASED_OUTPATIENT_CLINIC_OR_DEPARTMENT_OTHER)
Admission: EM | Admit: 2021-12-05 | Discharge: 2021-12-05 | Disposition: A | Discharge status: Home / Self Care | Payer: No Typology Code available for payment source | Attending: Emergency Medicine | Admitting: Emergency Medicine

## 2021-12-05 ENCOUNTER — Encounter (HOSPITAL_BASED_OUTPATIENT_CLINIC_OR_DEPARTMENT_OTHER): Payer: Self-pay | Admitting: Emergency Medicine

## 2021-12-05 ENCOUNTER — Other Ambulatory Visit: Payer: Self-pay

## 2021-12-05 DIAGNOSIS — W260XXA Contact with knife, initial encounter: Secondary | ICD-10-CM | POA: Diagnosis not present

## 2021-12-05 DIAGNOSIS — S6992XA Unspecified injury of left wrist, hand and finger(s), initial encounter: Secondary | ICD-10-CM | POA: Diagnosis present

## 2021-12-05 DIAGNOSIS — Y990 Civilian activity done for income or pay: Secondary | ICD-10-CM | POA: Diagnosis not present

## 2021-12-05 DIAGNOSIS — S61211A Laceration without foreign body of left index finger without damage to nail, initial encounter: Secondary | ICD-10-CM | POA: Insufficient documentation

## 2021-12-05 MED ORDER — LIDOCAINE HCL (PF) 1 % IJ SOLN
5.0000 mL | Freq: Once | INTRAMUSCULAR | Status: AC
  Administered 2021-12-05: 5 mL
  Filled 2021-12-05: qty 5

## 2021-12-05 NOTE — ED Triage Notes (Signed)
Pt reports lac to LT index finger from serrated knife while cutting bread at work

## 2021-12-05 NOTE — ED Provider Notes (Signed)
[facility] EMERGENCY DEPARTMENT Provider Note   CSN: [id number] Arrival date & time: 12/05/21  1855     History  Chief Complaint  Patient presents with   Laceration    Sheri Evans is a 46 y.o. female.  Patient is a 46 year old who presents with a laceration to her left index finger.  She was cutting some bread at work with a serrated knife and cut the edge of her finger.  She says its been oozing since that time.  It happened about 530 this afternoon.  She says her tetanus shot is up-to-date.       Home Medications Prior to Admission medications   Medication Sig Start Date End Date Taking? Authorizing Provider  EPINEPHrine (ADRENACLICK) 0.3 IB/7.0 mL IJ SOAJ injection Inject 0.3 mLs (0.3 mg total) into the muscle once. 04/29/15   Ward, Delice Bison, DO  HYDROcodone-acetaminophen (NORCO/VICODIN) 5-325 MG per tablet Take 1 tablet by mouth every 6 (six) hours as needed for moderate pain. Patient not taking: Reported on 12/20/2019    [provider]  ibuprofen (ADVIL,MOTRIN) 200 MG tablet Take 800 mg by mouth every 6 (six) hours as needed.    [provider]  meloxicam (MOBIC) 15 MG tablet Take 1 tablet (15 mg total) by mouth daily. Take 1 daily with food. 02/12/18   Margarita Mail, PA-C  Multiple Vitamin (MULTIVITAMIN WITH MINERALS) TABS tablet Take 1 tablet by mouth daily.    [provider]  ondansetron (ZOFRAN ODT) 4 MG disintegrating tablet Take 1 tablet (4 mg total) by mouth every 8 (eight) hours as needed for nausea or vomiting. Patient not taking: Reported on 12/20/2019 06/09/17   Ward, Delice Bison, DO  traMADol (ULTRAM) 50 MG tablet Take 1 tablet (50 mg total) by mouth every 6 (six) hours as needed. Patient not taking: Reported on 12/20/2019 02/12/18   Margarita Mail, PA-C  famotidine (PEPCID) 20 MG tablet Take 1 tablet (20 mg total) by mouth 2 (two) times daily. 04/29/15 09/30/18  Ward, Delice Bison, DO      Allergies    Kiwi extract,  Penicillins, Dilaudid [hydromorphone hcl], Wheat bran, and Doxycycline    Review of Systems   Review of Systems  Constitutional:  Negative for fever.  Gastrointestinal:  Negative for nausea and vomiting.  Musculoskeletal:  Positive for arthralgias. Negative for back pain, joint swelling and neck pain.  Skin:  Positive for wound.  Neurological:  Negative for weakness, numbness and headaches.    Physical Exam Updated Vital Signs BP 124/71   Pulse 68   Temp 98.5 F (36.9 C) (Oral)   Resp 20   Ht 5' 9"  (1.753 m)   Wt 113.4 kg   LMP 05/01/2011   SpO2 96%   BMI 36.92 kg/m  Physical Exam Constitutional:      Appearance: She is well-developed.  HENT:     Head: Normocephalic and atraumatic.  Cardiovascular:     Rate and Rhythm: Normal rate.  Pulmonary:     Effort: Pulmonary effort is normal.  Musculoskeletal:        General: Tenderness present.     Cervical back: Normal range of motion and neck supple.     Comments: 1 cm laceration to the radial side of the distal tip of the left index finger.  It is adjacent to the nail but does not go through the nail.  There is some oozing from the wound.  Capillary refill is less than 2 distally.  She has some  mild tenderness over the distal phalanx.  She can flex and extend the finger  Skin:    General: Skin is warm and dry.  Neurological:     Mental Status: She is alert and oriented to person, place, and time.     ED Results / Procedures / Treatments   Labs (all labs ordered are listed, but only abnormal results are displayed) Labs Reviewed - No data to display  EKG None  Radiology DG Finger Index Left  Result Date: 12/05/2021 CLINICAL DATA:  Cut with knife, assess for bony injury. EXAM: LEFT INDEX FINGER 2+V COMPARISON:  None Available. FINDINGS: No acute fracture or dislocation. A tiny bony density is present along the medial aspect of the distal interphalangeal joint which is likely chronic. Soft tissue defect with overlying  bandage at the distal second digit. No radiopaque foreign body is seen. IMPRESSION: No evidence of acute fracture or radiopaque foreign body. Electronically Signed   By: Brett Fairy M.D.   On: 12/05/2021 20:51    Procedures Procedures    Medications Ordered in ED Medications  lidocaine (PF) (XYLOCAINE) 1 % injection 5 mL (has no administration in time range)    ED Course/ Medical Decision Making/ A&P                           Medical Decision Making Amount and/or Complexity of Data Reviewed Radiology: ordered.  Risk Prescription drug management.   Patient is a 46 year old female who presents with a wound to her left index finger.  X-rays reveal no bony injury.  This was interpreted by me and confirmed by the radiologist.  The wound was injected with 1% plain lidocaine for anesthesia.  Was irrigated with normal saline.  On further expection, there is no suturable laceration.  She sliced a piece of the skin off.  There is no nail involvement.  There is still oozing from the wound.  Surgicel dressing was applied.  It was monitored and there was no further bleeding following this.  She was given wound care instructions and return precautions.  Her tetanus shot is up-to-date.  Final Clinical Impression(s) / ED Diagnoses Final diagnoses:  Laceration of left index finger without foreign body without damage to nail, initial encounter    Rx / DC Orders ED Discharge Orders     None         Malvin Johns, MD 12/05/21 2147

## 2022-01-27 ENCOUNTER — Telehealth: Payer: Self-pay | Admitting: Hematology and Oncology

## 2022-01-27 NOTE — Telephone Encounter (Signed)
Scheduled appt per 10/24 referral. Pt is aware of appt date and time. Pt is aware to arrive 15 mins prior to appt time and to bring and updated insurance card. Pt is aware of appt location.

## 2022-02-16 ENCOUNTER — Inpatient Hospital Stay: Payer: BC Managed Care – PPO | Admitting: Hematology and Oncology

## 2022-02-16 ENCOUNTER — Telehealth: Payer: Self-pay | Admitting: Hematology and Oncology

## 2022-02-16 ENCOUNTER — Inpatient Hospital Stay: Payer: BC Managed Care – PPO

## 2022-02-16 NOTE — Telephone Encounter (Signed)
Contacted patient to scheduled appointments. Left message with appointment details and a call back number if patient had any questions or could not accommodate the time we provided.   

## 2022-03-02 ENCOUNTER — Emergency Department (HOSPITAL_BASED_OUTPATIENT_CLINIC_OR_DEPARTMENT_OTHER): Payer: BC Managed Care – PPO

## 2022-03-02 ENCOUNTER — Other Ambulatory Visit: Payer: Self-pay

## 2022-03-02 ENCOUNTER — Encounter (HOSPITAL_BASED_OUTPATIENT_CLINIC_OR_DEPARTMENT_OTHER): Payer: Self-pay | Admitting: Urology

## 2022-03-02 ENCOUNTER — Emergency Department (HOSPITAL_BASED_OUTPATIENT_CLINIC_OR_DEPARTMENT_OTHER)
Admission: EM | Admit: 2022-03-02 | Discharge: 2022-03-02 | Disposition: A | Discharge status: Home / Self Care | Payer: BC Managed Care – PPO | Attending: Emergency Medicine | Admitting: Emergency Medicine

## 2022-03-02 DIAGNOSIS — R1031 Right lower quadrant pain: Secondary | ICD-10-CM | POA: Diagnosis not present

## 2022-03-02 LAB — CBC
HCT: 42.6 % (ref 36.0–46.0)
Hemoglobin: 14.5 g/dL (ref 12.0–15.0)
MCH: 30.9 pg (ref 26.0–34.0)
MCHC: 34 g/dL (ref 30.0–36.0)
MCV: 90.6 fL (ref 80.0–100.0)
Platelets: 220 10*3/uL (ref 150–400)
RBC: 4.7 MIL/uL (ref 3.87–5.11)
RDW: 12.3 % (ref 11.5–15.5)
WBC: 6.9 10*3/uL (ref 4.0–10.5)
nRBC: 0 % (ref 0.0–0.2)

## 2022-03-02 LAB — LIPASE, BLOOD: Lipase: 32 U/L (ref 11–51)

## 2022-03-02 LAB — COMPREHENSIVE METABOLIC PANEL
ALT: 23 U/L (ref 0–44)
AST: 21 U/L (ref 15–41)
Albumin: 4 g/dL (ref 3.5–5.0)
Alkaline Phosphatase: 50 U/L (ref 38–126)
Anion gap: 9 (ref 5–15)
BUN: 13 mg/dL (ref 6–20)
CO2: 27 mmol/L (ref 22–32)
Calcium: 9.2 mg/dL (ref 8.9–10.3)
Chloride: 102 mmol/L (ref 98–111)
Creatinine, Ser: 0.82 mg/dL (ref 0.44–1.00)
GFR, Estimated: >60 mL/min (ref >60)
Glucose, Bld: 111 mg/dL — ABNORMAL HIGH (ref 70–99)
Potassium: 4 mmol/L (ref 3.5–5.1)
Sodium: 138 mmol/L (ref 135–145)
Total Bilirubin: 0.9 mg/dL (ref 0.3–1.2)
Total Protein: 7.1 g/dL (ref 6.5–8.1)

## 2022-03-02 LAB — URINALYSIS, ROUTINE W REFLEX MICROSCOPIC
Bilirubin Urine: NEGATIVE
Glucose, UA: NEGATIVE mg/dL
Hgb urine dipstick: NEGATIVE
Ketones, ur: NEGATIVE mg/dL
Leukocytes,Ua: NEGATIVE
Nitrite: NEGATIVE
Protein, ur: NEGATIVE mg/dL
Specific Gravity, Urine: 1.015 (ref 1.005–1.030)
pH: 7 (ref 5.0–8.0)

## 2022-03-02 LAB — PREGNANCY, URINE: Preg Test, Ur: NEGATIVE

## 2022-03-02 MED ORDER — MORPHINE SULFATE (PF) 4 MG/ML IV SOLN
4.0000 mg | Freq: Once | INTRAVENOUS | Status: AC
  Administered 2022-03-02: 4 mg via INTRAVENOUS
  Filled 2022-03-02: qty 1

## 2022-03-02 MED ORDER — IOHEXOL 300 MG/ML  SOLN
125.0000 mL | Freq: Once | INTRAMUSCULAR | Status: AC | PRN
  Administered 2022-03-02: 125 mL via INTRAVENOUS

## 2022-03-02 MED ORDER — SODIUM CHLORIDE 0.9 % IV BOLUS
1000.0000 mL | Freq: Once | INTRAVENOUS | Status: AC
  Administered 2022-03-02: 1000 mL via INTRAVENOUS

## 2022-03-02 NOTE — ED Notes (Signed)
D/c paperwork reviewed with pt, including f/u care. No questions or concerns at time of d/c. Ambulatory to ED exit with spouse, NAD.

## 2022-03-02 NOTE — ED Provider Notes (Signed)
[facility] EMERGENCY DEPARTMENT Provider Note   CSN: [id number] Arrival date & time: 03/02/22  1705     History  Chief Complaint  Patient presents with   Abdominal Pain    Sheri Evans is a 46 y.o. female.   Abdominal Pain Presenting due to OB's concern for possible appendicitis. Performed bedside TVUS which did not show any evidence of ovarian torsion though he was unable to visualize blood flow to ovary. Given his concern for appendicitis, patient was recommended to ED. Recently treated with 2 rounds abx (levoquin first then doxy), finished Monday. Symptoms started Sunday night. Had cramping feeling. Milk of magnesia didn't help. Vomiting, fever. Then got better. Cramping persists. Reports a "slushy"feeling. Reports pain when hitting speed bumps. Pain constant but worse with movement. Not too bad at rest / lying down. Feels bloated. Food does not make better or worse. Hurts when she is jostled. Temp 101 first night. No dark components to vomitus. Denies diarrhea. Normal Bms. Right lower abdominal pain. History of right ovarian cysts but none visualized today. Reports initial umbilical pain that has migrated down and to the right.  Hx of celiac, c section x 3, partial hysterectomy, cholecystectomy. Other exploratory laparoscopy.      Home Medications Prior to Admission medications   Medication Sig Start Date End Date Taking? Authorizing Provider  EPINEPHrine (ADRENACLICK) 0.3 PY/0.9 mL IJ SOAJ injection Inject 0.3 mLs (0.3 mg total) into the muscle once. 04/29/15   Ward, Delice Bison, DO  HYDROcodone-acetaminophen (NORCO/VICODIN) 5-325 MG per tablet Take 1 tablet by mouth every 6 (six) hours as needed for moderate pain. Patient not taking: Reported on 12/20/2019    [provider]  ibuprofen (ADVIL,MOTRIN) 200 MG tablet Take 800 mg by mouth every 6 (six) hours as needed.    [provider]  meloxicam (MOBIC) 15 MG tablet Take 1 tablet (15 mg total) by  mouth daily. Take 1 daily with food. 02/12/18   Margarita Mail, PA-C  Multiple Vitamin (MULTIVITAMIN WITH MINERALS) TABS tablet Take 1 tablet by mouth daily.    [provider]  ondansetron (ZOFRAN ODT) 4 MG disintegrating tablet Take 1 tablet (4 mg total) by mouth every 8 (eight) hours as needed for nausea or vomiting. Patient not taking: Reported on 12/20/2019 06/09/17   Ward, Delice Bison, DO  traMADol (ULTRAM) 50 MG tablet Take 1 tablet (50 mg total) by mouth every 6 (six) hours as needed. Patient not taking: Reported on 12/20/2019 02/12/18   Margarita Mail, PA-C  famotidine (PEPCID) 20 MG tablet Take 1 tablet (20 mg total) by mouth 2 (two) times daily. 04/29/15 09/30/18  Ward, Delice Bison, DO      Allergies    Kiwi extract, Penicillins, Dilaudid [hydromorphone hcl], Wheat bran, and Doxycycline    Review of Systems   Review of Systems  Gastrointestinal:  Positive for abdominal pain.    Physical Exam Updated Vital Signs BP (!) 155/81   Pulse 84   Temp 98.2 F (36.8 C) (Oral)   Resp 16   Ht 5' 9"  (1.753 m)   Wt 113.4 kg   LMP 05/01/2011   SpO2 98%   BMI 36.92 kg/m  Physical Exam Constitutional:      General: She is not in acute distress.    Appearance: She is not diaphoretic.  HENT:     Head: Normocephalic and atraumatic.  Cardiovascular:     Rate and Rhythm: Normal rate and regular rhythm.     Heart sounds: No  murmur heard. Pulmonary:     Effort: Pulmonary effort is normal.     Breath sounds: Normal breath sounds.  Abdominal:     General: Abdomen is flat. A surgical scar is present. There is no distension.     Palpations: Abdomen is soft.     Tenderness: There is abdominal tenderness in the right lower quadrant. There is no guarding or rebound. Positive signs include McBurney's sign. Negative signs include Rovsing's sign.  Neurological:     Mental Status: She is alert.     ED Results / Procedures / Treatments   Labs (all labs ordered are listed, but only  abnormal results are displayed) Labs Reviewed  COMPREHENSIVE METABOLIC PANEL - Abnormal; Notable for the following components:      Result Value   Glucose, Bld 111 (*)    All other components within normal limits  LIPASE, BLOOD  CBC  URINALYSIS, ROUTINE W REFLEX MICROSCOPIC  PREGNANCY, URINE    EKG None  Radiology CT ABDOMEN PELVIS W CONTRAST  Result Date: 03/02/2022 CLINICAL DATA:  Right lower quadrant pain EXAM: CT ABDOMEN AND PELVIS WITH CONTRAST TECHNIQUE: Multidetector CT imaging of the abdomen and pelvis was performed using the standard protocol following bolus administration of intravenous contrast. RADIATION DOSE REDUCTION: This exam was performed according to the departmental dose-optimization program which includes automated exposure control, adjustment of the mA and/or kV according to patient size and/or use of iterative reconstruction technique. CONTRAST:  125 mL OMNIPAQUE IOHEXOL 300 MG/ML  SOLN COMPARISON:  12/14/2018 FINDINGS: Lower chest: No acute abnormality. Hepatobiliary: No focal liver abnormality is seen. Status post cholecystectomy. No biliary dilatation. Pancreas: Unremarkable. No pancreatic ductal dilatation or surrounding inflammatory changes. Spleen: Mildly enlarged, 13 cm without focal lesions. Adrenals/Urinary Tract: Adrenal glands are unremarkable. Kidneys are normal, without renal calculi, focal lesion, or hydronephrosis. Bladder is unremarkable. Stomach/Bowel: Stomach is within normal limits. Appendix appears normal. No evidence of bowel wall thickening, distention, or inflammatory changes. Diverticulosis noted of the sigmoid. Vascular/Lymphatic: No significant vascular findings are present. No enlarged abdominal or pelvic lymph nodes. Reproductive: Status post hysterectomy. No adnexal masses. Other: No abdominal wall hernia or abnormality. No abdominopelvic ascites. Musculoskeletal: No acute osseous abnormalities. Thoracolumbosacral degenerative changes noted.  IMPRESSION: 1. Mild splenomegaly. 2. Diverticulosis. 3. No acute abdominal or pelvic pathology identified. Electronically Signed   By: Sammie Bench M.D.   On: 03/02/2022 18:44    Procedures Procedures    Medications Ordered in ED Medications  sodium chloride 0.9 % bolus 1,000 mL (1,000 mLs Intravenous New Bag/Given 03/02/22 1836)  iohexol (OMNIPAQUE) 300 MG/ML solution 125 mL (125 mLs Intravenous Contrast Given 03/02/22 1822)    ED Course/ Medical Decision Making/ A&P                           Medical Decision Making Amount and/or Complexity of Data Reviewed Labs: ordered. Radiology: ordered.  Risk Prescription drug management.   Patient presents with right lower quadrant pain. Seen by OBGYN earlier today and underwent TVUS which was not suspicious for ovarian torsion though was unable to evaluate flow to ovary. CTAP with contrast without evidence of appendicitis or other acute abnormality. UA wnl. CBC normal - no leukocytosis or anemia. Electrolytes wnl. Lipase normal. LFTs normal. Pelvic US without acute abnormality. Patient with history of multiple abdominal surgeries likely with chronic pain from scar tissue. Discussed benign workup with patient and supportive care for her abdominal pain.  Final Clinical Impression(s) / ED Diagnoses Final diagnoses:  None    Rx / DC Orders ED Discharge Orders     None         Linward Natal, MD 03/02/22 2047    Drenda Freeze, MD 03/03/22 [phone]

## 2022-03-02 NOTE — ED Notes (Signed)
Ultrasound at bedside

## 2022-03-02 NOTE — ED Notes (Signed)
[place] complete, pt tolerated well

## 2022-03-02 NOTE — ED Notes (Signed)
Pt states rt abd pain and saw her GYN that did [place]  of her  ovaries sent for further testing as CP thought it may be appendix

## 2022-03-02 NOTE — ED Triage Notes (Signed)
Lower right pelvic pain that started Monday night  Went to OB/GYN today, had US OB was concerned for appendicitis and sent her for R/O  Fever at home, N/V

## 2022-03-02 NOTE — Discharge Instructions (Signed)
Your abdominal pain is likely related to your history of multiple abdominal surgeries. Imaging and labs this ED visit were reassuring and ruled out any life threatening process in your abdomin. If your symptoms acutely worsen or fail to improve with over the counter pain medication, please seek medical attention.

## 2022-03-08 ENCOUNTER — Telehealth: Payer: Self-pay | Admitting: Hematology and Oncology

## 2022-03-08 ENCOUNTER — Inpatient Hospital Stay: Payer: BC Managed Care – PPO

## 2022-03-08 ENCOUNTER — Inpatient Hospital Stay: Payer: BC Managed Care – PPO | Admitting: Hematology and Oncology

## 2022-03-08 NOTE — Telephone Encounter (Signed)
Contacted patient to scheduled appointments. Left message with appointment details and a call back number if patient had any questions or could not accommodate the time we provided.   

## 2022-03-22 ENCOUNTER — Inpatient Hospital Stay: Payer: BC Managed Care – PPO | Attending: Hematology and Oncology | Admitting: Hematology and Oncology

## 2022-03-22 ENCOUNTER — Inpatient Hospital Stay: Payer: BC Managed Care – PPO

## 2022-03-22 VITALS — BP 154/72 | HR 77 | Temp 97.5°F | Resp 18 | Ht 69.0 in | Wt 270.9 lb

## 2022-03-22 DIAGNOSIS — Z9189 Other specified personal risk factors, not elsewhere classified: Secondary | ICD-10-CM | POA: Insufficient documentation

## 2022-03-22 DIAGNOSIS — Z1501 Genetic susceptibility to malignant neoplasm of breast: Secondary | ICD-10-CM | POA: Diagnosis present

## 2022-03-22 DIAGNOSIS — Z791 Long term (current) use of non-steroidal anti-inflammatories (NSAID): Secondary | ICD-10-CM | POA: Insufficient documentation

## 2022-03-22 DIAGNOSIS — M1990 Unspecified osteoarthritis, unspecified site: Secondary | ICD-10-CM | POA: Insufficient documentation

## 2022-03-22 MED ORDER — TAMOXIFEN CITRATE 10 MG PO TABS: 10.0000 mg | ORAL_TABLET | Freq: Every day | ORAL | 3 refills | Status: DC

## 2022-03-22 NOTE — Progress Notes (Signed)
[facility] CONSULT NOTE  Patient Care Team: Marinda Elk as PCP - General (Internal Medicine)  CHIEF COMPLAINTS/PURPOSE OF CONSULTATION:  Newly diagnosed at high risk for breast cancer  HISTORY OF PRESENTING ILLNESS:  Sheri Evans 46 y.o. female is here because of recent diagnosis of being at high risk for breast cancer.  Patient had genetic testing 2 years ago and was determined that she was high risk for breast cancer but did not have any genetic mutation.  She underwent breast MRI along with the breast mammogram and did not have any abnormalities.  She was referred to [place] for discussion regarding risk reduction options.  I reviewed her records extensively and collaborated the history with the patient.     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   Ovarian cyst    Poison ivy 09/30/2014   posterior right knee   PONV (postoperative nausea and vomiting)    Rotator cuff tear 09/2014   right shoulder    SURGICAL HISTORY: Past Surgical History:  Procedure Laterality Date   ABDOMINAL HYSTERECTOMY  04/05/2011   partial   BACK SURGERY     CARPAL TUNNEL RELEASE Right    CERVICAL LAMINECTOMY     CESAREAN SECTION  1996, 2000, 05/07/2002   x 3   CHOLECYSTECTOMY     ENDOMETRIAL ABLATION  04/30/2010   ENDOMETRIAL ABLATION W/ NOVASURE  12/22/2006   HYSTEROSCOPY WITH D & C  12/22/2006   LAPAROSCOPY Right 01/23/2013   Procedure: LAPAROSCOPY OPERATIVE, Right Ovarian Cystectomy, Lysis of Adhesions;  Surgeon: Allena Katz, MD;  Location: [facility];  Service: Gynecology;  Laterality: Right;   SHOULDER ARTHROSCOPY WITH DISTAL CLAVICLE RESECTION Right 10/03/2014   Procedure: SHOULDER ARTHROSCOPY WITH DISTAL CLAVICLE RESECTION;  Surgeon: Marchia Bond, MD;  Location: [facility];  Service: Orthopedics;  Laterality:  Right;   SHOULDER ARTHROSCOPY WITH ROTATOR CUFF REPAIR AND SUBACROMIAL DECOMPRESSION Right 10/03/2014   Procedure: RIGHT SHOULDER ARTHROSCOPY DEBRIDEMENT,ACROMIOPLASTY,DISTAL CLAVICLE EXCISION,ROTATOR CUFF REPAIR ;  Surgeon: Marchia Bond, MD;  Location: [facility];  Service: Orthopedics;  Laterality: Right;   TONSILLECTOMY      SOCIAL HISTORY: Social History   Socioeconomic History   Marital status: Married    Spouse name: Not on file   Number of children: Not on file   Years of education: Not on file   Highest education level: Not on file  Occupational History   Not on file  Tobacco Use   Smoking status: Never   Smokeless tobacco: Never  Vaping Use   Vaping Use: Never used  Substance and Sexual Activity   Alcohol use: Yes    Comment: daily - 3 drinks   Drug use: No   Sexual activity: Yes    Birth control/protection: Surgical  Other Topics Concern   Not on file  Social History Narrative   Not on file   Social Determinants of Health   Financial Resource Strain: Not on file  Food Insecurity: Not on file  Transportation Needs: Not on file  Physical Activity: Not on file  Stress: Not on file  Social Connections: Not on file  Intimate Partner Violence: Not on file    FAMILY HISTORY: No family history on file.  ALLERGIES:  is allergic to kiwi extract, penicillins, dilaudid [hydromorphone hcl], wheat bran, and  doxycycline.  MEDICATIONS:  Current Outpatient Medications  Medication Sig Dispense Refill   [START ON 04/04/2022] tamoxifen (NOLVADEX) 10 MG tablet Take 1 tablet (10 mg total) by mouth daily. 90 tablet 3   EPINEPHrine (ADRENACLICK) 0.3 YK/9.9 mL IJ SOAJ injection Inject 0.3 mLs (0.3 mg total) into the muscle once. 1 Device 1   HYDROcodone-acetaminophen (NORCO/VICODIN) 5-325 MG per tablet Take 1 tablet by mouth every 6 (six) hours as needed for moderate pain. (Patient not taking: Reported on 12/20/2019)     ibuprofen (ADVIL,MOTRIN) 200 MG tablet Take  800 mg by mouth every 6 (six) hours as needed.     meloxicam (MOBIC) 15 MG tablet Take 1 tablet (15 mg total) by mouth daily. Take 1 daily with food. 10 tablet 0   Multiple Vitamin (MULTIVITAMIN WITH MINERALS) TABS tablet Take 1 tablet by mouth daily.     ondansetron (ZOFRAN ODT) 4 MG disintegrating tablet Take 1 tablet (4 mg total) by mouth every 8 (eight) hours as needed for nausea or vomiting. (Patient not taking: Reported on 12/20/2019) 20 tablet 0   traMADol (ULTRAM) 50 MG tablet Take 1 tablet (50 mg total) by mouth every 6 (six) hours as needed. (Patient not taking: Reported on 12/20/2019) 15 tablet 0   No current facility-administered medications for this visit.    REVIEW OF SYSTEMS:   Constitutional: Denies fevers, chills or abnormal night sweats Breast:  Denies any palpable lumps or discharge All other systems were reviewed with the patient and are negative.  PHYSICAL EXAMINATION: ECOG PERFORMANCE STATUS: 0 - Asymptomatic  Vitals:   03/22/22 1316  BP: (!) 154/72  Pulse: 77  Resp: 18  Temp: (!) 97.5 F (36.4 C)  SpO2: 98%   Filed Weights   03/22/22 1316  Weight: 270 lb 14.4 oz (122.9 kg)    GENERAL:alert, no distress and comfortable    LABORATORY DATA:  I have reviewed the data as listed Lab Results  Component Value Date   WBC 6.9 03/02/2022   HGB 14.5 03/02/2022   HCT 42.6 03/02/2022   MCV 90.6 03/02/2022   PLT 220 03/02/2022   Lab Results  Component Value Date   NA 138 03/02/2022   K 4.0 03/02/2022   CL 102 03/02/2022   CO2 27 03/02/2022    RADIOGRAPHIC STUDIES: I have personally reviewed the radiological reports and agreed with the findings in the report.  ASSESSMENT AND PLAN:  At high risk for breast cancer 1.  Risk assessment: ABaker Janus model: 5-year risk: 4.1% versus 1% B.  Tyrer-Cuzick model: 10-year risk: 10.8% versus 2.3% C.  Tyrer-Cuzick model: Lifetime risk 28.2% versus 10.1%  2. Risk reduction: A.  Pharmacological risk reduction:  Tamoxifen versus raloxifene patient is interested in taking tamoxifen for 50% risk reduction.  I recommended starting her on 10 mg a day based upon TAM-01 clinical trial.  She will start this on January 1 and I will connect with her in February to discuss tolerance to tamoxifen. B.  Nonpharmacological risk reduction: Stress importance of eating healthy, diet, exercise, supplements like vitamin D and turmeric, avoiding alcohol  3.  Breast cancer surveillance: A.  Mammograms annually B.  Breast MRIs annually versus every 2 to 3 years  Telephone visit in February to discuss tolerance to tamoxifen. She understands that if she has side effects to tamoxifen she will stop taking it     All questions were answered. The patient knows to call the clinic with any problems, questions or concerns.  Harriette Ohara, MD 03/22/22

## 2022-03-22 NOTE — Assessment & Plan Note (Signed)
1.  Risk assessment: ABaker Janus model: 5-year risk B.  Tyrer-Cuzick model: 10-year risk C.  Tyrer-Cuzick model: Lifetime risk  2. Risk reduction: A.  Pharmacological risk reduction: Tamoxifen versus raloxifene B.  Nonpharmacological risk reduction: Stress importance of eating healthy, diet, exercise, supplements like vitamin D and turmeric, avoiding alcohol  3.  Breast cancer surveillance: A.  Mammograms annually B.  Breast MRIs annually versus every 2 to 3 years  Return to clinic in 1 year for follow-up

## 2022-03-23 ENCOUNTER — Inpatient Hospital Stay: Payer: BC Managed Care – PPO

## 2022-03-23 ENCOUNTER — Inpatient Hospital Stay: Payer: BC Managed Care – PPO | Admitting: Hematology and Oncology

## 2022-05-11 ENCOUNTER — Telehealth: Payer: Self-pay | Admitting: Hematology and Oncology

## 2022-05-11 ENCOUNTER — Inpatient Hospital Stay: Payer: BC Managed Care – PPO | Attending: Hematology and Oncology | Admitting: Hematology and Oncology

## 2022-05-11 DIAGNOSIS — Z9189 Other specified personal risk factors, not elsewhere classified: Secondary | ICD-10-CM | POA: Diagnosis not present

## 2022-05-11 NOTE — Assessment & Plan Note (Signed)
1.  Risk assessment: ABaker Janus model: 5-year risk: 4.1% versus 1% B.  Tyrer-Cuzick model: 10-year risk: 10.8% versus 2.3% C.  Tyrer-Cuzick model: Lifetime risk 28.2% versus 10.1%  Risk reduction: Tamoxifen started 03/22/2022 Tamoxifen toxicities:   3.  Breast cancer surveillance: A.  Mammograms annually B.  Breast MRIs annually versus every 2 to 3 years

## 2022-05-11 NOTE — Progress Notes (Signed)
HEMATOLOGY-ONCOLOGY TELEPHONE VISIT PROGRESS NOTE  I connected with our patient on 05/11/22 at  2:00 PM EST by telephone and verified that I am speaking with the correct person using two identifiers.  I discussed the limitations, risks, security and privacy concerns of performing an evaluation and management service by telephone and the availability of in person appointments.  I also discussed with the patient that there may be a patient responsible charge related to this service. The patient expressed understanding and agreed to proceed.   History of Present Illness: Sheri Evans 47 y.o. female is here because of recent diagnosis of being at high risk for breast cancer.   She presents to the clinic for a telephone follow-up to discuss tolerance to tamoxifen.  REVIEW OF SYSTEMS:   Constitutional: Denies fevers, chills or abnormal weight loss All other systems were reviewed with the patient and are negative. Observations/Objective:     Assessment Plan:  At high risk for breast cancer 1.  Risk assessment: ABaker Janus model: 5-year risk: 4.1% versus 1% B.  Tyrer-Cuzick model: 10-year risk: 10.8% versus 2.3% C.  Tyrer-Cuzick model: Lifetime risk 28.2% versus 10.1%  Risk reduction: Tamoxifen started 03/22/2022 Tamoxifen toxicities: Tolerating tamoxifen extremely well without any problems or concerns.  Denies any hot flashes arthralgias or myalgias.   3.  Breast cancer surveillance: A.  Mammograms annually B.  Breast MRIs annually versus every 2 to 3 years   I discussed the assessment and treatment plan with the patient. The patient was provided an opportunity to ask questions and all were answered. The patient agreed with the plan and demonstrated an understanding of the instructions. The patient was advised to call back or seek an in-person evaluation if the symptoms worsen or if the condition fails to improve as anticipated.   I provided 12 minutes of non-face-to-face time during this  encounter.  This includes time for charting and coordination of care   Harriette Ohara, MD  I Gardiner Coins am acting as a scribe for Dr.Ariyona Eid  I have reviewed the above documentation for accuracy and completeness, and I agree with the above.

## 2022-05-11 NOTE — Telephone Encounter (Signed)
Scheduled appointment per 2/7 los. Left voicemail.  

## 2022-08-05 ENCOUNTER — Encounter: Payer: Self-pay | Admitting: Hematology and Oncology

## 2022-08-05 ENCOUNTER — Telehealth: Payer: Self-pay | Admitting: *Deleted

## 2022-08-05 NOTE — Telephone Encounter (Signed)
You can hold the tamoxifen for 2 weeks and see if the symptoms improve-and let us know- this is an unusual and not seen symptom with the tamoxifen- but if it started with onset of use of tamoxifen then holding it and monitoring how often this happens may be specific to you.  Secondly when was the last time you had an eye exam? This symptom may be coincidental with starting the tamoxifen so we do not want to overlook other issues.  Eye drooping can be more related to muscle weakness in the upper eyelid- so just want you well cared for.  I did try to call  you to discuss further- was unable to reach you- do let us know you received this message.  Vikki Ports RN ===View-only below this line===   ----- Message -----      From:Sheri Evans      Sent:08/05/2022 12:14 AM EDT        BM:[id number] [name], MD   Subject:Side effects  Hello!  Ever since I started on the tamoxefin, my left eye has had these droops. It's not a twitch but it's a pull. It almost closes. What do you suggest?   Thank you!

## 2023-01-14 ENCOUNTER — Other Ambulatory Visit: Payer: Self-pay | Admitting: Hematology and Oncology

## 2023-01-16 ENCOUNTER — Telehealth: Payer: Self-pay

## 2023-01-16 NOTE — Telephone Encounter (Signed)
Called Sheri Evans and left her a message about the side effects she was experiencing from tamoxifen. Asked her to call us back to let us know if everything is going OK with her anastrazole therapy. Lorayne Marek, RN

## 2023-01-16 NOTE — Telephone Encounter (Signed)
Will call patient to check on side effects. Lorayne Marek, RN

## 2023-01-18 ENCOUNTER — Encounter: Payer: Self-pay | Admitting: Hematology and Oncology

## 2023-05-15 ENCOUNTER — Inpatient Hospital Stay: Payer: BC Managed Care – PPO | Attending: Hematology and Oncology | Admitting: Hematology and Oncology

## 2023-05-15 NOTE — Assessment & Plan Note (Deleted)
 1.  Risk assessment: ADondra Spry model: 5-year risk: 4.1% versus 1% B.  Tyrer-Cuzick model: 10-year risk: 10.8% versus 2.3% C.  Tyrer-Cuzick model: Lifetime risk 28.2% versus 10.1%   Risk reduction: Tamoxifen started 03/22/2022 Tamoxifen toxicities: Tolerating tamoxifen extremely well without any problems or concerns.  Denies any hot flashes arthralgias or myalgias.   3.  Breast cancer surveillance: A.  Mammograms annually B.  Breast MRIs annually versus every 2 to 3 years

## 2023-06-26 ENCOUNTER — Emergency Department (HOSPITAL_BASED_OUTPATIENT_CLINIC_OR_DEPARTMENT_OTHER)

## 2023-06-26 ENCOUNTER — Encounter (HOSPITAL_BASED_OUTPATIENT_CLINIC_OR_DEPARTMENT_OTHER): Payer: Self-pay | Admitting: Emergency Medicine

## 2023-06-26 ENCOUNTER — Emergency Department (HOSPITAL_BASED_OUTPATIENT_CLINIC_OR_DEPARTMENT_OTHER)
Admission: EM | Admit: 2023-06-26 | Discharge: 2023-06-26 | Disposition: A | Discharge status: Home / Self Care | Attending: Emergency Medicine | Admitting: Emergency Medicine

## 2023-06-26 ENCOUNTER — Other Ambulatory Visit: Payer: Self-pay

## 2023-06-26 DIAGNOSIS — Z79899 Other long term (current) drug therapy: Secondary | ICD-10-CM | POA: Insufficient documentation

## 2023-06-26 DIAGNOSIS — R109 Unspecified abdominal pain: Secondary | ICD-10-CM | POA: Diagnosis present

## 2023-06-26 DIAGNOSIS — N838 Other noninflammatory disorders of ovary, fallopian tube and broad ligament: Secondary | ICD-10-CM | POA: Insufficient documentation

## 2023-06-26 LAB — URINALYSIS, ROUTINE W REFLEX MICROSCOPIC
Bilirubin Urine: NEGATIVE
Glucose, UA: NEGATIVE mg/dL
Hgb urine dipstick: NEGATIVE
Ketones, ur: NEGATIVE mg/dL
Nitrite: NEGATIVE
Protein, ur: NEGATIVE mg/dL
Specific Gravity, Urine: 1.015 (ref 1.005–1.030)
pH: 6 (ref 5.0–8.0)

## 2023-06-26 LAB — COMPREHENSIVE METABOLIC PANEL
ALT: 20 U/L (ref 0–44)
AST: 20 U/L (ref 15–41)
Albumin: 4 g/dL (ref 3.5–5.0)
Alkaline Phosphatase: 36 U/L — ABNORMAL LOW (ref 38–126)
Anion gap: 8 (ref 5–15)
BUN: 13 mg/dL (ref 6–20)
CO2: 24 mmol/L (ref 22–32)
Calcium: 9 mg/dL (ref 8.9–10.3)
Chloride: 105 mmol/L (ref 98–111)
Creatinine, Ser: 0.86 mg/dL (ref 0.44–1.00)
GFR, Estimated: >60 mL/min (ref >60)
Glucose, Bld: 97 mg/dL (ref 70–99)
Potassium: 4.1 mmol/L (ref 3.5–5.1)
Sodium: 137 mmol/L (ref 135–145)
Total Bilirubin: 0.7 mg/dL (ref 0.0–1.2)
Total Protein: 7.1 g/dL (ref 6.5–8.1)

## 2023-06-26 LAB — CBC
HCT: 42.5 % (ref 36.0–46.0)
Hemoglobin: 14.5 g/dL (ref 12.0–15.0)
MCH: 30.7 pg (ref 26.0–34.0)
MCHC: 34.1 g/dL (ref 30.0–36.0)
MCV: 90 fL (ref 80.0–100.0)
Platelets: 239 10*3/uL (ref 150–400)
RBC: 4.72 MIL/uL (ref 3.87–5.11)
RDW: 12.3 % (ref 11.5–15.5)
WBC: 7 10*3/uL (ref 4.0–10.5)
nRBC: 0 % (ref 0.0–0.2)

## 2023-06-26 LAB — URINALYSIS, MICROSCOPIC (REFLEX): RBC / HPF: NONE SEEN RBC/hpf (ref 0–5)

## 2023-06-26 LAB — LIPASE, BLOOD: Lipase: 28 U/L (ref 11–51)

## 2023-06-26 MED ORDER — IOHEXOL 300 MG/ML  SOLN
125.0000 mL | Freq: Once | INTRAMUSCULAR | Status: AC | PRN
  Administered 2023-06-26: 125 mL via INTRAVENOUS

## 2023-06-26 MED ORDER — MORPHINE SULFATE (PF) 4 MG/ML IV SOLN
4.0000 mg | Freq: Once | INTRAVENOUS | Status: AC
  Administered 2023-06-26: 4 mg via INTRAVENOUS
  Filled 2023-06-26: qty 1

## 2023-06-26 MED ORDER — ONDANSETRON HCL 4 MG/2ML IJ SOLN
4.0000 mg | Freq: Once | INTRAMUSCULAR | Status: AC
  Administered 2023-06-26: 4 mg via INTRAVENOUS
  Filled 2023-06-26: qty 2

## 2023-06-26 MED ORDER — MORPHINE SULFATE (PF) 4 MG/ML IV SOLN
8.0000 mg | Freq: Once | INTRAVENOUS | Status: AC
  Administered 2023-06-26: 8 mg via INTRAVENOUS
  Filled 2023-06-26: qty 2

## 2023-06-26 MED ORDER — ONDANSETRON 4 MG PO TBDP: 4.0000 mg | ORAL_TABLET | Freq: Three times a day (TID) | ORAL | 0 refills | Status: AC | PRN

## 2023-06-26 MED ORDER — KETOROLAC TROMETHAMINE 15 MG/ML IJ SOLN
15.0000 mg | Freq: Once | INTRAMUSCULAR | Status: AC
  Administered 2023-06-26: 15 mg via INTRAVENOUS
  Filled 2023-06-26: qty 1

## 2023-06-26 MED ORDER — ONDANSETRON 4 MG PO TBDP
4.0000 mg | ORAL_TABLET | Freq: Once | ORAL | Status: AC | PRN
  Administered 2023-06-26: 4 mg via ORAL
  Filled 2023-06-26: qty 1

## 2023-06-26 MED ORDER — OXYCODONE-ACETAMINOPHEN 5-325 MG PO TABS: 1.0000 | ORAL_TABLET | ORAL | 0 refills | Status: AC | PRN

## 2023-06-26 NOTE — ED Provider Notes (Signed)
 [facility] Provider Note   CSN: [id number] Arrival date & time: 06/26/23  1504     History  Chief Complaint  Patient presents with   Abdominal Pain    Sheri Evans is a 48 y.o. female.  HPI 48 year old female presents with right lower quadrant pain.  For the past couple days, she estimates starting 4 days ago she has been having some mid abdominal pain.  This morning in the middle of the night she woke up and was having some significant periumbilical pain.  When she woke back up this morning she developed right lower quadrant abdominal pain and has stayed there.  She has had some significant nausea which is better with some Zofran given in triage.  She felt hot at work today though she did not take her temperature so she is not sure if she had a fever.  No back pain or urinary symptoms.  Home Medications Prior to Admission medications   Medication Sig Start Date End Date Taking? Authorizing Provider  ondansetron (ZOFRAN-ODT) 4 MG disintegrating tablet Take 1 tablet (4 mg total) by mouth every 8 (eight) hours as needed for nausea or vomiting. 06/26/23  Yes Pricilla Loveless, MD  oxyCODONE-acetaminophen (PERCOCET/ROXICET) 5-325 MG tablet Take 1 tablet by mouth every 4 (four) hours as needed for severe pain (pain score 7-10). 06/26/23  Yes Pricilla Loveless, MD  EPINEPHrine (ADRENACLICK) 0.3 mg/0.3 mL IJ SOAJ injection Inject 0.3 mLs (0.3 mg total) into the muscle once. 04/29/15   Ward, Layla Maw, DO  ibuprofen (ADVIL,MOTRIN) 200 MG tablet Take 800 mg by mouth every 6 (six) hours as needed.    [provider]  Multiple Vitamin (MULTIVITAMIN WITH MINERALS) TABS tablet Take 1 tablet by mouth daily.    [provider]  tamoxifen (NOLVADEX) 10 MG tablet TAKE 1 TABLET BY MOUTH EVERY DAY 01/17/23   Serena Croissant, MD  famotidine (PEPCID) 20 MG tablet Take 1 tablet (20 mg total) by mouth 2 (two) times daily. 04/29/15 09/30/18  Ward,  Layla Maw, DO      Allergies    Kiwi extract, Penicillins, Dilaudid [hydromorphone hcl], Wheat, and Doxycycline    Review of Systems   Review of Systems  Constitutional:  Positive for fever (subjective).  Gastrointestinal:  Positive for abdominal pain and nausea. Negative for vomiting.  Genitourinary:  Negative for dysuria.  Musculoskeletal:  Negative for back pain.    Physical Exam Updated Vital Signs BP 137/72 (BP Location: Left Arm)   Pulse 71   Temp 98.6 F (37 C) (Oral)   Resp 18   Ht 5\' 10"  (1.778 m)   Wt 113.4 kg   LMP 05/01/2011   SpO2 100%   BMI 35.87 kg/m  Physical Exam Vitals and nursing note reviewed.  Constitutional:      General: She is not in acute distress.    Appearance: She is well-developed. She is not ill-appearing or diaphoretic.  HENT:     Head: Normocephalic and atraumatic.  Cardiovascular:     Rate and Rhythm: Normal rate and regular rhythm.     Heart sounds: Normal heart sounds.  Pulmonary:     Effort: Pulmonary effort is normal.     Breath sounds: Normal breath sounds.  Abdominal:     Palpations: Abdomen is soft.     Tenderness: There is abdominal tenderness in the right lower quadrant.  Skin:    General: Skin is warm and dry.  Neurological:  Mental Status: She is alert.     ED Results / Procedures / Treatments   Labs (all labs ordered are listed, but only abnormal results are displayed) Labs Reviewed  COMPREHENSIVE METABOLIC PANEL - Abnormal; Notable for the following components:      Result Value   Alkaline Phosphatase 36 (*)    All other components within normal limits  URINALYSIS, ROUTINE W REFLEX MICROSCOPIC - Abnormal; Notable for the following components:   Leukocytes,Ua TRACE (*)    All other components within normal limits  URINALYSIS, MICROSCOPIC (REFLEX) - Abnormal; Notable for the following components:   Bacteria, UA MANY (*)    All other components within normal limits  LIPASE, BLOOD  CBC     EKG None  Radiology US PELVIC COMPLETE W TRANSVAGINAL AND TORSION R/O Result Date: 06/26/2023 CLINICAL DATA:  Right lower quadrant pain EXAM: TRANSABDOMINAL AND TRANSVAGINAL ULTRASOUND OF PELVIS DOPPLER ULTRASOUND OF OVARIES TECHNIQUE: Both transabdominal and transvaginal ultrasound examinations of the pelvis were performed. Transabdominal technique was performed for global imaging of the pelvis including uterus, ovaries, adnexal regions, and pelvic cul-de-sac. It was necessary to proceed with endovaginal exam following the transabdominal exam to visualize the ovaries. Color and duplex Doppler ultrasound was utilized to evaluate blood flow to the ovaries. COMPARISON:  CT from earlier in the same day. FINDINGS: Uterus Surgically removed Right ovary Measurements: 8.1 x 5.6 x 5.5 cm. = volume: 13 mL. Multiple cystic lesions are identified similar to that seen on recent CT examination. The majority of these appear mildly complex. The largest of these measures 4.9 cm with a solid component within again similar to that seen on prior CT examination. Left ovary Surgically removed Pulsed Doppler evaluation of both ovaries demonstrates normal low-resistance arterial and venous waveforms. Other findings No abnormal free fluid. IMPRESSION: Status post hysterectomy and left oophorectomy. Large complex mass lesion arising from the right ovary as described. This is new from the prior examination of 03/02/2022. Ovarian neoplasm cannot be excluded. The solid component within one of the cystic areas is suspicious and further evaluation by means of nonemergent MRI is recommended. Electronically Signed   By: Alcide Clever M.D.   On: 06/26/2023 22:04   CT ABDOMEN PELVIS W CONTRAST Result Date: 06/26/2023 CLINICAL DATA:  Right lower quadrant abdominal pain and nausea EXAM: CT ABDOMEN AND PELVIS WITH CONTRAST TECHNIQUE: Multidetector CT imaging of the abdomen and pelvis was performed using the standard protocol following  bolus administration of intravenous contrast. RADIATION DOSE REDUCTION: This exam was performed according to the departmental dose-optimization program which includes automated exposure control, adjustment of the mA and/or kV according to patient size and/or use of iterative reconstruction technique. CONTRAST:  OMNIPAQUE IOHEXOL 300 MG/ML  SOLN COMPARISON:  03/02/2022 FINDINGS: Lower chest: Unremarkable Hepatobiliary: Cholecystectomy.  Otherwise unremarkable. Pancreas: Unremarkable Spleen: Unremarkable Adrenals/Urinary Tract: Unremarkable Stomach/Bowel: Normal appendix. No dilated bowel or specific abnormality identified. Vascular/Lymphatic: Unremarkable Reproductive: Uterus absent. 5.0 by 8.2 by 5.9 cm multi-septated, complex and somewhat heterogeneous right ovarian mass. This is a new appearance compared to 03/02/2022. Other: No supplemental non-categorized findings. Musculoskeletal: Age advanced lumbar spondylosis and degenerative disc disease causing substantial multilevel impingement. Congenitally short pedicles may also be contributory to the severity of impingement. IMPRESSION: 1. 5.0 by 8.2 by 5.9 cm multi-septated, complex and somewhat heterogeneous right ovarian mass. This is a new appearance compared to 03/02/2022. Possibilities might include complex ovarian cyst or ovarian neoplasm. Dedicated pelvic sonography recommended for further characterization. 2. Age advanced lumbar spondylosis and  degenerative disc disease causing substantial multilevel impingement. Congenitally short pedicles may also be contributory to the severity of impingement. 3. Normal appendix. 4. Cholecystectomy and hysterectomy. Electronically Signed   By: Gaylyn Rong M.D.   On: 06/26/2023 20:51    Procedures Procedures    Medications Ordered in ED Medications  ondansetron (ZOFRAN-ODT) disintegrating tablet 4 mg (4 mg Oral Given 06/26/23 1527)  morphine (PF) 4 MG/ML injection 4 mg (4 mg Intravenous Given 06/26/23  1837)  iohexol (OMNIPAQUE) 300 MG/ML solution 125 mL (125 mLs Intravenous Contrast Given 06/26/23 1855)  morphine (PF) 4 MG/ML injection 8 mg (8 mg Intravenous Given 06/26/23 1932)  ondansetron (ZOFRAN) injection 4 mg (4 mg Intravenous Given 06/26/23 1935)  ketorolac (TORADOL) 15 MG/ML injection 15 mg (15 mg Intravenous Given 06/26/23 2143)    ED Course/ Medical Decision Making/ A&P                                 Medical Decision Making Amount and/or Complexity of Data Reviewed Labs: ordered.    Details: Normal WBC. Radiology: ordered and independent interpretation performed.    Details: No appendicitis.  Large right ovary.  Risk Prescription drug management.   Patient CT does not show any evidence of appendicitis.  She was given a couple doses of morphine and later some Toradol.  She was ultimately found to have a right ovarian cyst/mass which ultimately does not have any evidence of torsion.  Discussed her case with Dr. Langston Masker of OB/GYN with [facility], the patient's primary provider.  Patient's pain is controlled enough that she feels comfortable going home so OB/GYN will arrange urgent outpatient follow-up.  I discussed the potential of this being cancerous with patient.  Ultimately, patient feels well enough for discharge and will be discharged home with pain prescription for oxycodone as well as a Zofran prescription.  Given return precautions.        Final Clinical Impression(s) / ED Diagnoses Final diagnoses:  Ovarian mass, right    Rx / DC Orders ED Discharge Orders          Ordered    oxyCODONE-acetaminophen (PERCOCET/ROXICET) 5-325 MG tablet  Every 4 hours PRN        06/26/23 2249    ondansetron (ZOFRAN-ODT) 4 MG disintegrating tablet  Every 8 hours PRN        06/26/23 2249              Pricilla Loveless, MD 06/26/23 2252

## 2023-06-26 NOTE — ED Triage Notes (Signed)
 Pt POV c/o RLQ pain, nausea, since last night.  hx of ovarian cyst   800 mg ibuprofen taken around 1200, mild relief.

## 2023-06-26 NOTE — Discharge Instructions (Signed)
 Your ultrasound is concerning for a mass on your right ovary.  It is very important follow-up with your gynecologist, call their office tomorrow.  We are prescribing you narcotic pain medicine to help with your pain but you may take ibuprofen and Tylenol in addition to these.  If you develop new or worsening or uncontrolled abdominal pain, vomiting, or any other new/concerning symptoms then return to the ER or call 911.

## 2023-06-28 ENCOUNTER — Telehealth: Payer: Self-pay

## 2023-06-28 NOTE — Telephone Encounter (Signed)
-----   Message from [place], [place] sent at 06/27/2023  2:25 PM EDT ----- Regarding: referral Hi,  Can we get this lady put on for a new visit spot on 4/14? And if someone cancels on 3/31 consider moving her up. Dr. Henderson Cloud called and spoke with me about her. She recently was in the ED for pain and has a mass.  Thanks, [employer]

## 2023-06-28 NOTE — Telephone Encounter (Signed)
 Spoke with Sheri Evans regarding her referral to GYN oncology. She has an appointment scheduled with Dr. Alvester Morin on 07/17/23 at 9:45. Patient agrees to date and time. She has been provided with office address and location. She is also aware of our mask and visitor policy. Patient verbalized understanding and will call with any questions.

## 2023-07-09 ENCOUNTER — Encounter: Payer: Self-pay | Admitting: Psychiatry

## 2023-07-17 ENCOUNTER — Inpatient Hospital Stay: Admitting: Psychiatry

## 2023-07-18 NOTE — Progress Notes (Signed)
 This encounter was created in error - please disregard.

## 2023-12-14 ENCOUNTER — Other Ambulatory Visit: Payer: Self-pay | Admitting: Obstetrics and Gynecology

## 2023-12-14 DIAGNOSIS — R1031 Right lower quadrant pain: Secondary | ICD-10-CM

## 2023-12-19 ENCOUNTER — Encounter: Payer: Self-pay | Admitting: Obstetrics and Gynecology

## 2023-12-20 ENCOUNTER — Ambulatory Visit
Admission: RE | Admit: 2023-12-20 | Discharge: 2023-12-20 | Disposition: A | Discharge status: Home / Self Care | Source: Ambulatory Visit | Attending: Obstetrics and Gynecology | Admitting: Obstetrics and Gynecology

## 2023-12-20 DIAGNOSIS — R1031 Right lower quadrant pain: Secondary | ICD-10-CM

## 2023-12-20 MED ORDER — IOPAMIDOL (ISOVUE-300) INJECTION 61%
100.0000 mL | Freq: Once | INTRAVENOUS | Status: AC | PRN
  Administered 2023-12-20: 100 mL via INTRAVENOUS

## 2024-01-19 ENCOUNTER — Other Ambulatory Visit: Payer: Self-pay | Admitting: Medical Genetics

## 2024-02-15 ENCOUNTER — Other Ambulatory Visit

## 2024-02-15 DIAGNOSIS — Z006 Encounter for examination for normal comparison and control in clinical research program: Secondary | ICD-10-CM

## 2024-02-19 ENCOUNTER — Ambulatory Visit: Admitting: Plastic Surgery

## 2024-02-19 VITALS — BP 137/72 | HR 78 | Ht 69.0 in | Wt 255.0 lb

## 2024-02-19 DIAGNOSIS — N62 Hypertrophy of breast: Secondary | ICD-10-CM | POA: Diagnosis not present

## 2024-02-19 DIAGNOSIS — M542 Cervicalgia: Secondary | ICD-10-CM | POA: Diagnosis not present

## 2024-02-19 DIAGNOSIS — M549 Dorsalgia, unspecified: Secondary | ICD-10-CM | POA: Insufficient documentation

## 2024-02-19 DIAGNOSIS — M545 Low back pain, unspecified: Secondary | ICD-10-CM

## 2024-02-19 DIAGNOSIS — Z6837 Body mass index (BMI) 37.0-37.9, adult: Secondary | ICD-10-CM

## 2024-02-19 DIAGNOSIS — M546 Pain in thoracic spine: Secondary | ICD-10-CM

## 2024-02-19 DIAGNOSIS — G8929 Other chronic pain: Secondary | ICD-10-CM

## 2024-02-19 NOTE — Progress Notes (Signed)
 Patient ID: Sheri Evans, female    DOB: Aug 03, 1975, 48 y.o.   MRN: [id number]   Chief Complaint  Patient presents with   Consult    Breast reduction and [facility]    Mammary Hyperplasia: The patient is a 48 y.o. female with a history of mammary hyperplasia for several years.  She has extremely large breasts causing symptoms that include the following: Back pain in the upper and lower back, including neck pain. She pulls or pins her bra straps to provide better lift and relief of the pressure and pain. She notices relief by holding her breast up manually.  Her shoulder straps cause grooves and pain and pressure that requires padding for relief. Pain medication is sometimes required with motrin and tylenol .  Activities that are hindered by enlarged breasts include: exercise and running.  She has tried supportive clothing as well as fitted bras without improvement.  Her breasts are extremely large and fairly symmetric.  She has hyperpigmentation of the inframammary area on both sides.  The sternal to nipple distance on the right is 34 cm and the left is 33 cm.  The IMF distance is 15 cm.  She is 5 feet 9 inches tall and weighs 255 pounds.  The BMI = 37.7 kg/m.  Preoperative bra size = D/DD cup.  The estimated excess breast tissue to be removed at the time of surgery = 850 grams on the left and 850 grams on the right.  Mammogram history: 2024 negative.  Family history of breast cancer:  no.  Tobacco use:  no.   The patient expresses the desire to pursue surgical intervention.  Patient has had back surgery as well as neck surgery.    Review of Systems  Constitutional:  Positive for activity change. Negative for appetite change.  HENT: Negative.    Eyes: Negative.   Respiratory: Negative.    Cardiovascular: Negative.   Gastrointestinal: Negative.   Endocrine: Negative.   Genitourinary: Negative.   Musculoskeletal:  Positive for back pain and neck pain.  Skin:  Positive for rash.    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   Ovarian cyst    Poison ivy 09/30/2014   posterior right knee   PONV (postoperative nausea and vomiting)    Rotator cuff tear 09/2014   right shoulder    Past Surgical History:  Procedure Laterality Date   ABDOMINAL HYSTERECTOMY  04/05/2011   partial   BACK SURGERY     CARPAL TUNNEL RELEASE Right    CERVICAL LAMINECTOMY     CESAREAN SECTION  1996, 2000, 05/07/2002   x 3   CHOLECYSTECTOMY     ENDOMETRIAL ABLATION  04/30/2010   ENDOMETRIAL ABLATION W/ NOVASURE  12/22/2006   HYSTEROSCOPY WITH D & C  12/22/2006   LAPAROSCOPY Right 01/23/2013   Procedure: LAPAROSCOPY OPERATIVE, Right Ovarian Cystectomy, Lysis of Adhesions;  Surgeon: Lynwood FORBES Curlene PONCE, MD;  Location: [facility];  Service: Gynecology;  Laterality: Right;   SHOULDER ARTHROSCOPY WITH DISTAL CLAVICLE RESECTION Right 10/03/2014   Procedure: SHOULDER ARTHROSCOPY WITH DISTAL CLAVICLE RESECTION;  Surgeon: Fonda Olmsted, MD;  Location:  [facility];  Service: Orthopedics;  Laterality: Right;   SHOULDER ARTHROSCOPY WITH ROTATOR CUFF REPAIR AND SUBACROMIAL DECOMPRESSION Right 10/03/2014   Procedure: RIGHT SHOULDER ARTHROSCOPY DEBRIDEMENT,ACROMIOPLASTY,DISTAL CLAVICLE EXCISION,ROTATOR CUFF REPAIR ;  Surgeon: Fonda  Josefina, MD;  Location: [facility];  Service: Orthopedics;  Laterality: Right;   TONSILLECTOMY        Current Outpatient Medications:    EPINEPHrine  (ADRENACLICK ) 0.3 mg/0.3 mL IJ SOAJ injection, Inject 0.3 mLs (0.3 mg total) into the muscle once., Disp: 1 Device, Rfl: 1   ibuprofen (ADVIL,MOTRIN) 200 MG tablet, Take 800 mg by mouth every 6 (six) hours as needed., Disp: , Rfl:    Multiple Vitamin (MULTIVITAMIN WITH MINERALS) TABS tablet, Take 1 tablet by mouth daily., Disp: , Rfl:    ondansetron  (ZOFRAN -ODT) 4 MG disintegrating tablet, Take 1 tablet (4  mg total) by mouth every 8 (eight) hours as needed for nausea or vomiting., Disp: 10 tablet, Rfl: 0   oxyCODONE -acetaminophen  (PERCOCET/ROXICET) 5-325 MG tablet, Take 1 tablet by mouth every 4 (four) hours as needed for severe pain (pain score 7-10)., Disp: 15 tablet, Rfl: 0   tamoxifen  (NOLVADEX ) 10 MG tablet, TAKE 1 TABLET BY MOUTH EVERY DAY, Disp: 90 tablet, Rfl: 3   Objective:   Vitals:   02/19/24 0925  BP: 137/72  Pulse: 78  SpO2: 97%    Physical Exam Vitals reviewed.  Constitutional:      Appearance: Normal appearance.  HENT:     Head: Atraumatic.  Cardiovascular:     Rate and Rhythm: Normal rate.     Pulses: Normal pulses.  Pulmonary:     Effort: Pulmonary effort is normal.  Abdominal:     General: There is no distension.     Palpations: Abdomen is soft.     Tenderness: There is no abdominal tenderness.  Musculoskeletal:        General: No swelling or deformity.  Skin:    General: Skin is warm.     Capillary Refill: Capillary refill takes less than 2 seconds.     Coloration: Skin is not jaundiced.     Findings: No bruising.  Neurological:     Mental Status: She is alert and oriented to person, place, and time.  Psychiatric:        Mood and Affect: Mood normal.        Behavior: Behavior normal.        Thought Content: Thought content normal.        Judgment: Judgment normal.     Assessment & Plan:  Symptomatic mammary hypertrophy  Chronic bilateral thoracic back pain  Neck pain  The procedure the patient selected and that was best for the patient was discussed. The risk were discussed and include but not limited to the following:  Breast asymmetry, fluid accumulation, firmness of the breast, inability to breast feed, loss of nipple or areola, skin loss, change in skin and nipple sensation, fat necrosis of the breast tissue, bleeding, infection and healing delay.  There are risks of anesthesia and injury to nerves or blood vessels.  Allergic reaction to tape,  suture and skin glue are possible.  There will be swelling.  Any of these can lead to the need for revisional surgery which is not included in this surgery.  A breast reduction has potential to interfere with diagnostic procedures in the future.  This procedure is best done when the breast is fully developed.  Changes in the breast will continue to occur over time: pregnancy, weight gain or weigh loss. No guarantees are given for a certain bra or breast size.    Total time: 40 minutes. This includes time spent with the patient during the visit as well as time  spent before and after the visit reviewing the chart, documenting the encounter, ordering pertinent studies and literature for the patient.   Physical therapy: Not required Mammogram: Due in 1 month Patient is a good candidate for bilateral breast reduction with liposuction.  I will submit this to insurance.  I think I can get off around 850 g I do not think much more than that.  Risk and complications were reviewed.  Pictures were obtained of the patient and placed in the chart with the patient's or guardian's permission.   Estefana RAMAN Lux Skilton, DO

## 2024-02-21 ENCOUNTER — Other Ambulatory Visit

## 2024-03-01 ENCOUNTER — Institutional Professional Consult (permissible substitution): Admitting: Plastic Surgery

## 2024-03-04 LAB — GENECONNECT MOLECULAR SCREEN: Genetic Analysis Overall Interpretation: NEGATIVE

## 2024-03-06 ENCOUNTER — Other Ambulatory Visit: Payer: Self-pay | Admitting: Neurological Surgery

## 2024-03-06 DIAGNOSIS — M542 Cervicalgia: Secondary | ICD-10-CM

## 2024-03-06 DIAGNOSIS — M5416 Radiculopathy, lumbar region: Secondary | ICD-10-CM

## 2024-03-07 ENCOUNTER — Encounter: Payer: Self-pay | Admitting: Neurological Surgery

## 2024-03-26 ENCOUNTER — Institutional Professional Consult (permissible substitution): Admitting: Plastic Surgery

## 2024-04-02 ENCOUNTER — Ambulatory Visit
Admission: RE | Admit: 2024-04-02 | Discharge: 2024-04-02 | Disposition: A | Discharge status: Home / Self Care | Source: Ambulatory Visit | Attending: Neurological Surgery | Admitting: Neurological Surgery

## 2024-04-02 DIAGNOSIS — M5416 Radiculopathy, lumbar region: Secondary | ICD-10-CM

## 2024-04-02 DIAGNOSIS — M542 Cervicalgia: Secondary | ICD-10-CM

## 2024-04-02 MED ORDER — GADOPICLENOL 0.5 MMOL/ML IV SOLN
10.0000 mL | Freq: Once | INTRAVENOUS | Status: AC | PRN
  Administered 2024-04-02: 10 mL via INTRAVENOUS
